# Patient Record
Sex: Male | Born: 1990
Health system: Southern US, Community
[De-identification: ages and names within clinical notes are randomized; demographics above are authoritative.]

## PROBLEM LIST (undated history)

## (undated) DIAGNOSIS — S069X9A Unspecified intracranial injury with loss of consciousness of unspecified duration, initial encounter: Secondary | ICD-10-CM

## (undated) DIAGNOSIS — S82899A Other fracture of unspecified lower leg, initial encounter for closed fracture: Secondary | ICD-10-CM

## (undated) DIAGNOSIS — S069XAA Unspecified intracranial injury with loss of consciousness status unknown, initial encounter: Secondary | ICD-10-CM

## (undated) HISTORY — PX: WISDOM TOOTH EXTRACTION: SHX21

## (undated) HISTORY — PX: TONSILLECTOMY: SUR1361

## (undated) HISTORY — PX: CRANIOTOMY: SHX93

## (undated) HISTORY — PX: ORIF FEMUR FRACTURE: SHX2119

---
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
## 2013-03-30 ENCOUNTER — Encounter: Payer: Self-pay | Admitting: *Deleted

## 2013-04-01 ENCOUNTER — Ambulatory Visit (INDEPENDENT_AMBULATORY_CARE_PROVIDER_SITE_OTHER): Payer: 59 | Admitting: Nurse Practitioner

## 2013-04-01 ENCOUNTER — Encounter: Payer: Self-pay | Admitting: Nurse Practitioner

## 2013-04-01 VITALS — BP 126/78 | Temp 98.2°F | Wt 231.0 lb

## 2013-04-01 DIAGNOSIS — J309 Allergic rhinitis, unspecified: Secondary | ICD-10-CM

## 2013-04-01 DIAGNOSIS — L049 Acute lymphadenitis, unspecified: Secondary | ICD-10-CM

## 2013-04-01 DIAGNOSIS — H6690 Otitis media, unspecified, unspecified ear: Secondary | ICD-10-CM

## 2013-04-01 DIAGNOSIS — H6692 Otitis media, unspecified, left ear: Secondary | ICD-10-CM

## 2013-04-01 MED ORDER — AZITHROMYCIN 250 MG PO TABS: ORAL_TABLET | ORAL | Status: DC

## 2013-04-01 NOTE — Patient Instructions (Addendum)
Nasacort AQ as directed. Continue Allegra or Zyrtec Zaditor eye drops

## 2013-04-02 ENCOUNTER — Encounter: Payer: Self-pay | Admitting: Nurse Practitioner

## 2013-04-02 NOTE — Progress Notes (Signed)
Subjective:  Presents for complaints of a knot behind his right ear for the past week. Has gone down in size. Not as tender. No fever. Also frequent head congestion sneezing itchy watery eyes for the past few weeks. Does a lot of work outdoors. No ear pain sore throat headache. No history of scalp rash or tick bite. No wheezing. No cough. Frequent clearing of his throat. No acid reflux or abdominal pain.  Objective:   BP 126/78  Temp(Src) 98.2 F (36.8 C) (Oral)  Wt 231 lb (104.781 kg) NAD. Alert, oriented. Right TM significant clear effusion, no erythema. Left TM dull with moderate erythema. Pharynx mildly erythematous with cloudy PND noted. Conjunctiva mildly injected. Neck supple with mild anterior nontender adenopathy. 2 small fluctuant lymph nodes noted behind the right ear. Nontender to palpation. No scalp lesions or rash noted. No occipital lymph nodes noted. Lungs clear. Heart regular rate rhythm.  Assessment:Otitis media, left  Allergic rhinitis  Acute lymphadenitis  Plan: Meds ordered this encounter  Medications  . azithromycin (ZITHROMAX Z-PAK) 250 MG tablet    Sig: Take 2 tablets (500 mg) on  Day 1,  followed by 1 tablet (250 mg) once daily on Days 2 through 5.    Dispense:  6 each    Refill:  0    Order Specific Question:  Supervising Provider    Answer:  Merlyn Albert [[id number]]   Nasacort AQ as directed. Continue Allegra or Zyrtec Zaditor eye drops  Expect gradual resolution of lymphadenopathy. Call back in 2 weeks if persists, sooner if worse.

## 2013-07-18 ENCOUNTER — Emergency Department (HOSPITAL_COMMUNITY)
Admission: EM | Admit: 2013-07-18 | Discharge: 2013-07-18 | Disposition: A | Discharge status: Home / Self Care | Payer: PRIVATE HEALTH INSURANCE | Attending: Emergency Medicine | Admitting: Emergency Medicine

## 2013-07-18 ENCOUNTER — Emergency Department (HOSPITAL_COMMUNITY): Payer: PRIVATE HEALTH INSURANCE

## 2013-07-18 ENCOUNTER — Encounter (HOSPITAL_COMMUNITY): Payer: Self-pay | Admitting: *Deleted

## 2013-07-18 DIAGNOSIS — S82841A Displaced bimalleolar fracture of right lower leg, initial encounter for closed fracture: Secondary | ICD-10-CM

## 2013-07-18 DIAGNOSIS — S82843A Displaced bimalleolar fracture of unspecified lower leg, initial encounter for closed fracture: Secondary | ICD-10-CM | POA: Insufficient documentation

## 2013-07-18 DIAGNOSIS — Y9389 Activity, other specified: Secondary | ICD-10-CM | POA: Insufficient documentation

## 2013-07-18 DIAGNOSIS — Y9355 Activity, bike riding: Secondary | ICD-10-CM | POA: Insufficient documentation

## 2013-07-18 DIAGNOSIS — Z87891 Personal history of nicotine dependence: Secondary | ICD-10-CM | POA: Insufficient documentation

## 2013-07-18 MED ORDER — OXYCODONE-ACETAMINOPHEN 5-325 MG PO TABS
2.0000 | ORAL_TABLET | Freq: Once | ORAL | Status: AC
  Administered 2013-07-18: 2 via ORAL
  Filled 2013-07-18: qty 2

## 2013-07-18 MED ORDER — OXYCODONE-ACETAMINOPHEN 5-325 MG PO TABS: 1.0000 | ORAL_TABLET | Freq: Four times a day (QID) | ORAL | Status: DC | PRN

## 2013-07-18 MED ORDER — IBUPROFEN 800 MG PO TABS
800.0000 mg | ORAL_TABLET | Freq: Once | ORAL | Status: AC
  Administered 2013-07-18: 800 mg via ORAL
  Filled 2013-07-18: qty 1

## 2013-07-18 MED ORDER — DICLOFENAC SODIUM 75 MG PO TBEC: 75.0000 mg | DELAYED_RELEASE_TABLET | Freq: Two times a day (BID) | ORAL | Status: DC

## 2013-07-18 MED ORDER — ONDANSETRON HCL 4 MG PO TABS
4.0000 mg | ORAL_TABLET | Freq: Once | ORAL | Status: AC
  Administered 2013-07-18: 4 mg via ORAL
  Filled 2013-07-18: qty 1
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
## 2013-07-19 NOTE — ED Provider Notes (Signed)
Medical screening examination/treatment/procedure(s) were performed by non-physician practitioner and as supervising physician I was immediately available for consultation/collaboration.  Flint Melter, MD 07/19/13 [phone]

## 2013-07-21 ENCOUNTER — Ambulatory Visit (INDEPENDENT_AMBULATORY_CARE_PROVIDER_SITE_OTHER): Payer: PRIVATE HEALTH INSURANCE | Admitting: Orthopedic Surgery

## 2013-07-21 ENCOUNTER — Encounter: Payer: Self-pay | Admitting: Orthopedic Surgery

## 2013-07-21 VITALS — BP 128/79 | Ht 69.0 in | Wt 213.0 lb

## 2013-07-21 DIAGNOSIS — S8263XA Displaced fracture of lateral malleolus of unspecified fibula, initial encounter for closed fracture: Secondary | ICD-10-CM | POA: Insufficient documentation

## 2013-07-21 DIAGNOSIS — S8261XA Displaced fracture of lateral malleolus of right fibula, initial encounter for closed fracture: Secondary | ICD-10-CM

## 2013-07-21 NOTE — Patient Instructions (Signed)
RTW Monday  
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
## 2013-09-07 ENCOUNTER — Ambulatory Visit (INDEPENDENT_AMBULATORY_CARE_PROVIDER_SITE_OTHER): Payer: PRIVATE HEALTH INSURANCE

## 2013-09-07 ENCOUNTER — Ambulatory Visit (INDEPENDENT_AMBULATORY_CARE_PROVIDER_SITE_OTHER): Payer: Self-pay | Admitting: Orthopedic Surgery

## 2013-09-07 ENCOUNTER — Encounter: Payer: Self-pay | Admitting: Orthopedic Surgery

## 2013-09-07 VITALS — BP 122/87 | Ht 69.0 in | Wt 213.0 lb

## 2013-09-07 DIAGNOSIS — S82891D Other fracture of right lower leg, subsequent encounter for closed fracture with routine healing: Secondary | ICD-10-CM

## 2013-09-07 DIAGNOSIS — IMO0001 Reserved for inherently not codable concepts without codable children: Secondary | ICD-10-CM
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
## 2013-09-09 ENCOUNTER — Other Ambulatory Visit: Payer: Self-pay

## 2015-07-19 ENCOUNTER — Emergency Department (HOSPITAL_COMMUNITY)
Admission: EM | Admit: 2015-07-19 | Discharge: 2015-07-19 | Disposition: A | Discharge status: Home / Self Care | Payer: PRIVATE HEALTH INSURANCE | Attending: Emergency Medicine | Admitting: Emergency Medicine

## 2015-07-19 ENCOUNTER — Encounter (HOSPITAL_COMMUNITY): Payer: Self-pay | Admitting: *Deleted

## 2015-07-19 ENCOUNTER — Emergency Department (HOSPITAL_COMMUNITY): Payer: PRIVATE HEALTH INSURANCE

## 2015-07-19 DIAGNOSIS — Z87891 Personal history of nicotine dependence: Secondary | ICD-10-CM | POA: Insufficient documentation

## 2015-07-19 DIAGNOSIS — Y9289 Other specified places as the place of occurrence of the external cause: Secondary | ICD-10-CM | POA: Insufficient documentation

## 2015-07-19 DIAGNOSIS — S8262XA Displaced fracture of lateral malleolus of left fibula, initial encounter for closed fracture: Secondary | ICD-10-CM | POA: Diagnosis not present

## 2015-07-19 DIAGNOSIS — Y998 Other external cause status: Secondary | ICD-10-CM | POA: Insufficient documentation

## 2015-07-19 DIAGNOSIS — S82892A Other fracture of left lower leg, initial encounter for closed fracture: Secondary | ICD-10-CM

## 2015-07-19 DIAGNOSIS — Y9389 Activity, other specified: Secondary | ICD-10-CM | POA: Insufficient documentation

## 2015-07-19 DIAGNOSIS — W1843XA Slipping, tripping and stumbling without falling due to stepping from one level to another, initial encounter: Secondary | ICD-10-CM | POA: Insufficient documentation

## 2015-07-19 DIAGNOSIS — S99912A Unspecified injury of left ankle, initial encounter: Secondary | ICD-10-CM | POA: Diagnosis present

## 2015-07-19 HISTORY — DX: Other fracture of unspecified lower leg, initial encounter for closed fracture: S82.899A
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
## 2015-07-19 NOTE — ED Notes (Signed)
Pt left ED, on crutches with no signs of distress. Pt verbalized discharge instructions.

## 2015-07-19 NOTE — ED Provider Notes (Signed)
CSN: [id number]     Arrival date & time 07/19/15  2023 History   First MD Initiated Contact with Patient 07/19/15 2041     Chief Complaint  Patient presents with  . Ankle Pain     (Consider location/radiation/quality/duration/timing/severity/associated sxs/prior Treatment) HPI   Luis Jarvis is a 24 y.o. male who presents to the Emergency Department complaining of left ankle pain after an inversion injury earlier today.  He describes a sharp , throbbing pain and immediate swelling of the lateral ankle.  Pain is worse weight bearing.  He reports a fx to the same ankle 1-2 years ago.  He is using crutches for weight bearing.  He denies numbness, open wounds, foot pain, or pain/swelling proximal to the ankle.     Past Medical History  Diagnosis Date  . Fx ankle    Past Surgical History  Procedure Laterality Date  . Tonsillectomy    . Wisdom tooth extraction     History reviewed. No pertinent family history. Social History  Substance Use Topics  . Smoking status: Former Smoker    Types: Cigarettes  . Smokeless tobacco: None  . Alcohol Use: No    Review of Systems  Constitutional: Negative for fever and chills.  Genitourinary: Negative for dysuria and difficulty urinating.  Musculoskeletal: Positive for joint swelling and arthralgias.  Skin: Negative for color change and wound.  All other systems reviewed and are negative.     Allergies  Review of patient's allergies indicates no known allergies.  Home Medications   Prior to Admission medications   Medication Sig Start Date End Date Taking? Authorizing Provider  diclofenac (VOLTAREN) 75 MG EC tablet Take 1 tablet (75 mg total) by mouth 2 (two) times daily. 07/18/13   Ivery Quale, PA-C  oxyCODONE-acetaminophen (PERCOCET/ROXICET) 5-325 MG per tablet Take 1 tablet by mouth every 6 (six) hours as needed for pain. 07/18/13   Ivery Quale, PA-C   BP 133/76 mmHg  Pulse 90  Temp(Src) 98.5 F (36.9 C) (Oral)  Resp 18   Ht 6' (1.829 m)  Wt 213 lb (96.616 kg)  BMI 28.88 kg/m2  SpO2 100% Physical Exam  Constitutional: He is oriented to person, place, and time. He appears well-developed and well-nourished. No distress.  HENT:  Head: Normocephalic and atraumatic.  Cardiovascular: Normal rate, regular rhythm, normal heart sounds and intact distal pulses.   Pulmonary/Chest: Effort normal and breath sounds normal.  Musculoskeletal: He exhibits edema and tenderness.  ttp of the lateral left ankle with moderate edema present.    DP pulse is brisk,distal sensation intact.  No erythema, abrasion, bruising or bony deformity.  No proximal tenderness.  Achilles tendon NT.  Compartments soft  Neurological: He is alert and oriented to person, place, and time. He exhibits normal muscle tone. Coordination normal.  Skin: Skin is warm and dry.  Nursing note and vitals reviewed.   ED Course  Procedures (including critical care time) Labs Review Labs Reviewed - No data to display  Imaging Review Dg Ankle Complete Left  07/19/2015   CLINICAL DATA:  Rolled ankle stepping off tractor. Ankle injury and pain. Initial encounter.  EXAM: LEFT ANKLE COMPLETE - 3+ VIEW  COMPARISON:  None.  FINDINGS: Lateral and anterior soft tissue swelling noted. A tiny ossific density is seen along the inferior aspect of the lateral malleolus, consistent with a tiny avulsion fracture fragment. No other fractures are identified. No evidence of dislocation.  IMPRESSION: Tiny avulsion fracture fragment along the inferior aspect of the  lateral malleolus, with associated soft tissue swelling.   Electronically Signed   By: Myles Rosenthal M.D.   On: 07/19/2015 21:14   I have personally reviewed and evaluated these images and lab results as part of my medical decision-making.   EKG Interpretation None      MDM   Final diagnoses:  None    On chart review, previous ankle injury was on the right.    ASO applied, very minimal avulsion fx on XR,  remains NV intact.  Pt has own crutches.  He agrees to RICE therapy and close orthopedic f/u.  Prefers to take tylenol or ibuprofen if needed for pain.  Vitals stable, pt stable for d/c    Pauline Aus, PA-C 07/19/15 2352  Eber Hong, MD 07/20/15 2201

## 2015-07-19 NOTE — Discharge Instructions (Signed)
Ankle Fracture °A fracture is a break in a bone. A cast or splint may be used to protect the ankle and heal the break. Sometimes, surgery is needed. °HOME CARE °· Use crutches as told by your doctor. It is very important that you use your crutches correctly. °· Do not put weight or pressure on the injured ankle until told by your doctor. °· Keep your ankle raised (elevated) when sitting or lying down. °· Apply ice to the ankle: °¨ Put ice in a plastic bag. °¨ Place a towel between your cast and the bag. °¨ Leave the ice on for 20 minutes, 2-3 times a day. °· If you have a plaster or fiberglass cast: °¨ Do not try to scratch under the cast with any objects. °¨ Check the skin around the cast every day. You may put lotion on red or sore areas. °¨ Keep your cast dry and clean. °· If you have a plaster splint: °¨ Wear the splint as told by your doctor. °¨ You can loosen the elastic around the splint if your toes get numb, tingle, or turn cold or blue. °· Do not put pressure on any part of your cast or splint. It may break. Rest your plaster splint or cast only on a pillow the first 24 hours until it is fully hardened. °· Cover your cast or splint with a plastic bag during showers. °· Do not lower your cast or splint into water. °· Take medicine as told by your doctor. °· Do not drive until your doctor says it is safe. °· Follow-up with your doctor as told. It is very important that you go to your follow-up visits. °GET HELP IF: °The swelling and discomfort gets worse.  °GET HELP RIGHT AWAY IF:  °· Your splint or cast breaks. °· You continue to have very bad pain. °· You have new pain or swelling after your splint or cast was put on. °· Your skin or toes below the injured ankle: °¨ Turn blue or gray. °¨ Feel cold, numb, or you cannot feel them. °· There is a bad smell or yellowish white fluid (pus) coming from under the splint or cast. °MAKE SURE YOU:  °· Understand these instructions. °· Will watch your  condition. °· Will get help right away if you are not doing well or get worse. °Document Released: 08/18/2009 Document Revised: 08/11/2013 Document Reviewed: 05/20/2013 °Exit[employer]® Patient Information ©2015 [employer]. This information is not intended to replace advice given to you by your health care provider. Make sure you discuss any questions you have with your health care provider. ° °

## 2015-12-01 ENCOUNTER — Ambulatory Visit (INDEPENDENT_AMBULATORY_CARE_PROVIDER_SITE_OTHER): Payer: PRIVATE HEALTH INSURANCE | Admitting: Family Medicine

## 2015-12-01 VITALS — Wt 237.0 lb

## 2015-12-01 DIAGNOSIS — R5383 Other fatigue: Secondary | ICD-10-CM | POA: Diagnosis not present

## 2015-12-01 DIAGNOSIS — A630 Anogenital (venereal) warts: Secondary | ICD-10-CM | POA: Diagnosis not present

## 2015-12-01 NOTE — Progress Notes (Signed)
   Subjective:    Patient ID: Luis Jarvis, male    DOB: [birth year], 25 y.o.   MRN: [id number]  HPI Patient arrives with c/o wart like bumps on bottom-painful.  he does state that this area is been present for several weeks it just bothers him is difficult to clean himself when he goes to the bathroom he denies having this problem before   in addition this a lot of fatigue tiredness feeling rundown during the day with sleepiness as well he isn't quite sure why he is having this is been going on for months he does snore at nighttime denies fever chills change and appetite.  Review of Systems  see above negative for cough chest pain abdominal pain vomiting diarrhea fever chills    Objective:   Physical Exam  lungs clear heart regular abdomen soft HEENT benign has thick neck   mild obesity.     Assessment & Plan:   significant fatigue tiredness feeling rundown related to probable sleep apnea will check lab work all lab work negative proceed forward with sleep study in addition to this patient is doing a sleep survey will turn it back and  Rectal warts referral to dermatology

## 2015-12-02 ENCOUNTER — Encounter: Payer: Self-pay | Admitting: Family Medicine

## 2015-12-02 LAB — BASIC METABOLIC PANEL
BUN/Creatinine Ratio: 13 (ref 8–19)
BUN: 12 mg/dL (ref 6–20)
CALCIUM: 9.9 mg/dL (ref 8.7–10.2)
CHLORIDE: 102 mmol/L (ref 96–106)
CO2: 22 mmol/L (ref 18–29)
Creatinine, Ser: 0.9 mg/dL (ref 0.76–1.27)
GFR calc non Af Amer: 121 mL/min/{1.73_m2} (ref >59)
GFR, EST AFRICAN AMERICAN: 140 mL/min/{1.73_m2} (ref >59)
Glucose: 95 mg/dL (ref 65–99)
POTASSIUM: 4 mmol/L (ref 3.5–5.2)
SODIUM: 144 mmol/L (ref 134–144)

## 2015-12-02 LAB — CBC WITH DIFFERENTIAL/PLATELET
Basophils Absolute: 0 10*3/uL (ref 0.0–0.2)
Basos: 0 %
EOS (ABSOLUTE): 0.2 10*3/uL (ref 0.0–0.4)
Eos: 2 %
Hematocrit: 47.5 % (ref 37.5–51.0)
Hemoglobin: 16.5 g/dL (ref 12.6–17.7)
IMMATURE GRANULOCYTES: 1 %
Immature Grans (Abs): 0.1 10*3/uL (ref 0.0–0.1)
Lymphocytes Absolute: 2 10*3/uL (ref 0.7–3.1)
Lymphs: 19 %
MCH: 30.6 pg (ref 26.6–33.0)
MCHC: 34.7 g/dL (ref 31.5–35.7)
MCV: 88 fL (ref 79–97)
MONOS ABS: 0.5 10*3/uL (ref 0.1–0.9)
Monocytes: 5 %
NEUTROS PCT: 73 %
Neutrophils Absolute: 7.5 10*3/uL — ABNORMAL HIGH (ref 1.4–7.0)
PLATELETS: 234 10*3/uL (ref 150–379)
RBC: 5.4 x10E6/uL (ref 4.14–5.80)
RDW: 12.3 % (ref 12.3–15.4)
WBC: 10.3 10*3/uL (ref 3.4–10.8)

## 2015-12-02 LAB — TSH: TSH: 1.11 u[IU]/mL (ref 0.450–4.500)

## 2015-12-05 ENCOUNTER — Encounter: Payer: Self-pay | Admitting: Family Medicine

## 2015-12-14 DIAGNOSIS — A630 Anogenital (venereal) warts: Secondary | ICD-10-CM | POA: Diagnosis not present

## 2016-01-04 DIAGNOSIS — A630 Anogenital (venereal) warts: Secondary | ICD-10-CM | POA: Diagnosis not present

## 2016-01-25 DIAGNOSIS — A630 Anogenital (venereal) warts: Secondary | ICD-10-CM | POA: Diagnosis not present

## 2016-02-15 DIAGNOSIS — A630 Anogenital (venereal) warts: Secondary | ICD-10-CM | POA: Diagnosis not present

## 2016-02-22 ENCOUNTER — Encounter: Payer: Self-pay | Admitting: Family Medicine

## 2016-02-22 ENCOUNTER — Ambulatory Visit (INDEPENDENT_AMBULATORY_CARE_PROVIDER_SITE_OTHER): Payer: PRIVATE HEALTH INSURANCE | Admitting: Family Medicine

## 2016-02-22 VITALS — BP 138/86 | Temp 100.4°F | Ht 71.5 in | Wt 228.0 lb

## 2016-02-22 DIAGNOSIS — N419 Inflammatory disease of prostate, unspecified: Secondary | ICD-10-CM | POA: Diagnosis not present

## 2016-02-22 DIAGNOSIS — M545 Low back pain: Secondary | ICD-10-CM | POA: Diagnosis not present

## 2016-02-22 LAB — POCT URINALYSIS DIPSTICK
Leukocytes, UA: NEGATIVE
RBC UA: NEGATIVE
Spec Grav, UA: 1.015
pH, UA: 5

## 2016-02-22 MED ORDER — CIPROFLOXACIN HCL 500 MG PO TABS
500.0000 mg | ORAL_TABLET | Freq: Two times a day (BID) | ORAL | Status: AC
Start: 2016-02-22 — End: 2016-03-03

## 2016-02-22 NOTE — Progress Notes (Signed)
   Subjective:    Patient ID: Luis Jarvis, male    DOB: 03/15/1991, 25 y.o.   MRN: [id number]  Fever  This is a new problem. Episode onset: 2 days ago. Associated symptoms comments: Body aches . He has tried acetaminophen for the symptoms. The treatment provided mild relief.    Results for orders placed or performed in visit on 02/22/16  POCT urinalysis dipstick  Result Value Ref Range   Color, UA Dark Yellow    Clarity, UA Clear    Glucose, UA     Bilirubin, UA     Ketones, UA     Spec Grav, UA 1.015    Blood, UA Negative    pH, UA 5.0    Protein, UA     Urobilinogen, UA     Nitrite, UA     Leukocytes, UA Negative Negative   Pos dysuria  Low bk pain aching in joints diminished energy flulike symptoms. Increase frequency positive dysuria and diffuse low back no nausea or vomiting  Review of Systems  Constitutional: Positive for fever.   see above     Objective:   Physical Exam  Alert vital stable lungs clear heart rare rhythm no CVA tenderness abdomen benign prostate boggy and tender  Urinalysis 4 white blood cells hypertrophy      Assessment & Plan:  Impression acute prostatitis plan antibiotics prescribed. Symptom care warning signs discussed WSL

## 2016-02-29 DIAGNOSIS — A630 Anogenital (venereal) warts: Secondary | ICD-10-CM | POA: Diagnosis not present

## 2016-05-02 ENCOUNTER — Encounter (HOSPITAL_COMMUNITY): Payer: Self-pay | Admitting: Emergency Medicine

## 2016-05-02 ENCOUNTER — Emergency Department (HOSPITAL_COMMUNITY)
Admission: EM | Admit: 2016-05-02 | Discharge: 2016-05-02 | Disposition: A | Discharge status: Home / Self Care | Payer: PRIVATE HEALTH INSURANCE | Attending: Dermatology | Admitting: Dermatology

## 2016-05-02 DIAGNOSIS — Z5321 Procedure and treatment not carried out due to patient leaving prior to being seen by health care provider: Secondary | ICD-10-CM | POA: Insufficient documentation

## 2016-05-02 DIAGNOSIS — H5713 Ocular pain, bilateral: Secondary | ICD-10-CM | POA: Insufficient documentation

## 2016-05-02 DIAGNOSIS — F17210 Nicotine dependence, cigarettes, uncomplicated: Secondary | ICD-10-CM | POA: Insufficient documentation

## 2016-05-02 HISTORY — DX: Unspecified intracranial injury with loss of consciousness status unknown, initial encounter: S06.9XAA

## 2016-05-02 HISTORY — DX: Unspecified intracranial injury with loss of consciousness of unspecified duration, initial encounter: S06.9X9A

## 2016-05-02 NOTE — ED Notes (Signed)
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

## 2016-09-23 ENCOUNTER — Telehealth: Payer: Self-pay | Admitting: Family Medicine

## 2016-09-23 DIAGNOSIS — Z0000 Encounter for general adult medical examination without abnormal findings: Secondary | ICD-10-CM

## 2016-09-23 NOTE — Telephone Encounter (Signed)
Tried to call no answer. Orders ready

## 2016-09-23 NOTE — Telephone Encounter (Signed)
Lipid, liver, metabolic 7, CBC 

## 2016-09-23 NOTE — Telephone Encounter (Signed)
Pt is needing lab orders sent over for an upcoming physical. Last labs per epic were: tsh,bmp,and cbc on 12/01/15.

## 2016-09-30 NOTE — Telephone Encounter (Signed)
Pt.notified

## 2016-10-02 LAB — BASIC METABOLIC PANEL
BUN/Creatinine Ratio: 15 (ref 9–20)
BUN: 14 mg/dL (ref 6–20)
CALCIUM: 9.4 mg/dL (ref 8.7–10.2)
CO2: 26 mmol/L (ref 18–29)
Chloride: 101 mmol/L (ref 96–106)
Creatinine, Ser: 0.96 mg/dL (ref 0.76–1.27)
GFR, EST AFRICAN AMERICAN: 128 mL/min/{1.73_m2} (ref >59)
GFR, EST NON AFRICAN AMERICAN: 111 mL/min/{1.73_m2} (ref >59)
Glucose: 96 mg/dL (ref 65–99)
Potassium: 4.6 mmol/L (ref 3.5–5.2)
Sodium: 141 mmol/L (ref 134–144)

## 2016-10-02 LAB — CBC WITH DIFFERENTIAL/PLATELET
BASOS ABS: 0 10*3/uL (ref 0.0–0.2)
Basos: 0 %
EOS (ABSOLUTE): 0.2 10*3/uL (ref 0.0–0.4)
EOS: 3 %
HEMATOCRIT: 46.9 % (ref 37.5–51.0)
Hemoglobin: 16.4 g/dL (ref 12.6–17.7)
IMMATURE GRANULOCYTES: 0 %
Immature Grans (Abs): 0 10*3/uL (ref 0.0–0.1)
LYMPHS ABS: 1.7 10*3/uL (ref 0.7–3.1)
Lymphs: 29 %
MCH: 30.7 pg (ref 26.6–33.0)
MCHC: 35 g/dL (ref 31.5–35.7)
MCV: 88 fL (ref 79–97)
MONOS ABS: 0.5 10*3/uL (ref 0.1–0.9)
Monocytes: 8 %
NEUTROS PCT: 60 %
Neutrophils Absolute: 3.5 10*3/uL (ref 1.4–7.0)
PLATELETS: 207 10*3/uL (ref 150–379)
RBC: 5.35 x10E6/uL (ref 4.14–5.80)
RDW: 12.9 % (ref 12.3–15.4)
WBC: 5.8 10*3/uL (ref 3.4–10.8)

## 2016-10-02 LAB — HEPATIC FUNCTION PANEL
ALBUMIN: 4.9 g/dL (ref 3.5–5.5)
ALT: 29 IU/L (ref 0–44)
AST: 14 IU/L (ref 0–40)
Alkaline Phosphatase: 66 IU/L (ref 39–117)
BILIRUBIN TOTAL: 0.8 mg/dL (ref 0.0–1.2)
Bilirubin, Direct: 0.18 mg/dL (ref 0.00–0.40)
Total Protein: 6.9 g/dL (ref 6.0–8.5)

## 2016-10-02 LAB — LIPID PANEL
CHOL/HDL RATIO: 3.6 ratio (ref 0.0–5.0)
CHOLESTEROL TOTAL: 175 mg/dL (ref 100–199)
HDL: 49 mg/dL (ref >39)
LDL CALC: 97 mg/dL (ref 0–99)
Triglycerides: 144 mg/dL (ref 0–149)
VLDL Cholesterol Cal: 29 mg/dL (ref 5–40)

## 2016-10-08 ENCOUNTER — Encounter: Payer: Self-pay | Admitting: Family Medicine

## 2016-10-08 ENCOUNTER — Ambulatory Visit (INDEPENDENT_AMBULATORY_CARE_PROVIDER_SITE_OTHER): Payer: PRIVATE HEALTH INSURANCE | Admitting: Family Medicine

## 2016-10-08 VITALS — BP 124/74 | Ht 70.5 in | Wt 235.0 lb

## 2016-10-08 DIAGNOSIS — Z0000 Encounter for general adult medical examination without abnormal findings: Secondary | ICD-10-CM

## 2016-10-08 NOTE — Patient Instructions (Signed)
Steps to Quit Smoking Smoking tobacco can be harmful to your health and can affect almost every organ in your body. Smoking puts you, and those around you, at risk for developing many serious chronic diseases. Quitting smoking is difficult, but it is one of the best things that you can do for your health. It is never too late to quit. What are the benefits of quitting smoking? When you quit smoking, you lower your risk of developing serious diseases and conditions, such as:  Lung cancer or lung disease, such as COPD.  Heart disease.  Stroke.  Heart attack.  Infertility.  Osteoporosis and bone fractures.  Additionally, symptoms such as coughing, wheezing, and shortness of breath may get better when you quit. You may also find that you get sick less often because your body is stronger at fighting off colds and infections. If you are pregnant, quitting smoking can help to reduce your chances of having a baby of low birth weight. How do I get ready to quit? When you decide to quit smoking, create a plan to make sure that you are successful. Before you quit:  Pick a date to quit. Set a date within the next two weeks to give you time to prepare.  Write down the reasons why you are quitting. Keep this list in places where you will see it often, such as on your bathroom mirror or in your car or wallet.  Identify the people, places, things, and activities that make you want to smoke (triggers) and avoid them. Make sure to take these actions: ? Throw away all cigarettes at home, at work, and in your car. ? Throw away smoking accessories, such as ashtrays and lighters. ? Clean your car and make sure to empty the ashtray. ? Clean your home, including curtains and carpets.  Tell your family, friends, and coworkers that you are quitting. Support from your loved ones can make quitting easier.  Talk with your health care provider about your options for quitting smoking.  Find out what treatment  options are covered by your health insurance.  What strategies can I use to quit smoking? Talk with your healthcare provider about different strategies to quit smoking. Some strategies include:  Quitting smoking altogether instead of gradually lessening how much you smoke over a period of time. Research shows that quitting "cold turkey" is more successful than gradually quitting.  Attending in-person counseling to help you build problem-solving skills. You are more likely to have success in quitting if you attend several counseling sessions. Even short sessions of 10 minutes can be effective.  Finding resources and support systems that can help you to quit smoking and remain smoke-free after you quit. These resources are most helpful when you use them often. They can include: ? Online chats with a counselor. ? Telephone quitlines. ? Printed self-help materials. ? Support groups or group counseling. ? Text messaging programs. ? Mobile phone applications.  Taking medicines to help you quit smoking. (If you are pregnant or breastfeeding, talk with your health care provider first.) Some medicines contain nicotine and some do not. Both types of medicines help with cravings, but the medicines that include nicotine help to relieve withdrawal symptoms. Your health care provider may recommend: ? Nicotine patches, gum, or lozenges. ? Nicotine inhalers or sprays. ? Non-nicotine medicine that is taken by mouth.  Talk with your health care provider about combining strategies, such as taking medicines while you are also receiving in-person counseling. Using these two strategies together   makes you more likely to succeed in quitting than if you used either strategy on its own. If you are pregnant or breastfeeding, talk with your health care provider about finding counseling or other support strategies to quit smoking. Do not take medicine to help you quit smoking unless told to do so by your health care  provider. What things can I do to make it easier to quit? Quitting smoking might feel overwhelming at first, but there is a lot that you can do to make it easier. Take these important actions:  Reach out to your family and friends and ask that they support and encourage you during this time. Call telephone quitlines, reach out to support groups, or work with a counselor for support.  Ask people who smoke to avoid smoking around you.  Avoid places that trigger you to smoke, such as bars, parties, or smoke-break areas at work.  Spend time around people who do not smoke.  Lessen stress in your life, because stress can be a smoking trigger for some people. To lessen stress, try: ? Exercising regularly. ? Deep-breathing exercises. ? Yoga. ? Meditating. ? Performing a body scan. This involves closing your eyes, scanning your body from head to toe, and noticing which parts of your body are particularly tense. Purposefully relax the muscles in those areas.  Download or purchase mobile phone or tablet apps (applications) that can help you stick to your quit plan by providing reminders, tips, and encouragement. There are many free apps, such as QuitGuide from the CDC (Centers for Disease Control and Prevention). You can find other support for quitting smoking (smoking cessation) through smokefree.gov and other websites.  How will I feel when I quit smoking? Within the first 24 hours of quitting smoking, you may start to feel some withdrawal symptoms. These symptoms are usually most noticeable 2-3 days after quitting, but they usually do not last beyond 2-3 weeks. Changes or symptoms that you might experience include:  Mood swings.  Restlessness, anxiety, or irritation.  Difficulty concentrating.  Dizziness.  Strong cravings for sugary foods in addition to nicotine.  Mild weight gain.  Constipation.  Nausea.  Coughing or a sore throat.  Changes in how your medicines work in your  body.  A depressed mood.  Difficulty sleeping (insomnia).  After the first 2-3 weeks of quitting, you may start to notice more positive results, such as:  Improved sense of smell and taste.  Decreased coughing and sore throat.  Slower heart rate.  Lower blood pressure.  Clearer skin.  The ability to breathe more easily.  Fewer sick days.  Quitting smoking is very challenging for most people. Do not get discouraged if you are not successful the first time. Some people need to make many attempts to quit before they achieve long-term success. Do your best to stick to your quit plan, and talk with your health care provider if you have any questions or concerns. This information is not intended to replace advice given to you by your health care provider. Make sure you discuss any questions you have with your health care provider. Document Released: 10/15/2001 Document Revised: 06/18/2016 Document Reviewed: 03/07/2015 Elsevier Interactive Patient Education  2017 [employer]  

## 2016-10-08 NOTE — Progress Notes (Signed)
   Subjective:    Patient ID: Luis Jarvis, male    DOB: 01/29/1991, 25 y.o.   MRN: [id number]  HPI The patient comes in today for a wellness visit.    A review of their health history was completed.  A review of medications was also completed.  Any needed refills; not taking any meds  Eating habits: health conscious  Falls/  MVA accidents in past few months: none  Regular exercise: none  Specialist pt sees on regular basis: none  Preventative health issues were discussed.   Additional concerns: none  Declines flu vaccine   Review of Systems  Constitutional: Negative for activity change, appetite change and fever.  HENT: Negative for congestion and rhinorrhea.   Eyes: Negative for discharge.  Respiratory: Negative for cough and wheezing.   Cardiovascular: Negative for chest pain.  Gastrointestinal: Negative for abdominal pain, blood in stool and vomiting.  Genitourinary: Negative for difficulty urinating and frequency.  Musculoskeletal: Negative for neck pain.  Skin: Negative for rash.  Allergic/Immunologic: Negative for environmental allergies and food allergies.  Neurological: Negative for weakness and headaches.  Psychiatric/Behavioral: Negative for agitation.       Objective:   Physical Exam  Constitutional: He appears well-developed and well-nourished.  HENT:  Head: Normocephalic and atraumatic.  Right Ear: External ear normal.  Left Ear: External ear normal.  Nose: Nose normal.  Mouth/Throat: Oropharynx is clear and moist.  Eyes: EOM are normal. Pupils are equal, round, and reactive to light.  Neck: Normal range of motion. Neck supple. No thyromegaly present.  Cardiovascular: Normal rate, regular rhythm and normal heart sounds.   No murmur heard. Pulmonary/Chest: Effort normal and breath sounds normal. No respiratory distress. He has no wheezes.  Abdominal: Soft. Bowel sounds are normal. He exhibits no distension and no mass. There is no tenderness.    Genitourinary: Penis normal.  Musculoskeletal: Normal range of motion. He exhibits no edema.  Lymphadenopathy:    He has no cervical adenopathy.  Neurological: He is alert. He exhibits normal muscle tone.  Skin: Skin is warm and dry. No erythema.  Psychiatric: He has a normal mood and affect. His behavior is normal. Judgment normal.          Assessment & Plan:  Adult wellness-complete.wellness physical was conducted today. Importance of diet and exercise were discussed in detail. In addition to this a discussion regarding safety was also covered. We also reviewed over immunizations and gave recommendations regarding current immunization needed for age. In addition to this additional areas were also touched on including: Preventative health exams needed: Colonoscopy Not indicated Patient was encouraged to lose weight current exercise watch diet follow-up for yearly checkup lab work was reviewed with the patient  Patient was advised yearly wellness exam

## 2017-03-03 ENCOUNTER — Ambulatory Visit: Payer: PRIVATE HEALTH INSURANCE | Admitting: Nurse Practitioner

## 2017-03-19 ENCOUNTER — Ambulatory Visit (INDEPENDENT_AMBULATORY_CARE_PROVIDER_SITE_OTHER): Payer: PRIVATE HEALTH INSURANCE | Admitting: Family Medicine

## 2017-03-19 ENCOUNTER — Encounter: Payer: Self-pay | Admitting: Family Medicine

## 2017-03-19 VITALS — BP 118/82 | Temp 97.8°F | Ht 70.5 in | Wt 245.4 lb

## 2017-03-19 DIAGNOSIS — L03112 Cellulitis of left axilla: Secondary | ICD-10-CM | POA: Diagnosis not present

## 2017-03-19 MED ORDER — CEPHALEXIN 500 MG PO CAPS: 500.0000 mg | ORAL_CAPSULE | Freq: Four times a day (QID) | ORAL | 0 refills | Status: DC

## 2017-03-19 NOTE — Progress Notes (Signed)
   Subjective:    Patient ID: Luis Jarvis, male    DOB: 11/20/1990, 26 y.o.   MRN: [id number]  HPI  Patient arrives with a knot in left arm pit- sore to touch for a few weeks. Been sore to the touch over the past couple weeks concerned about the possibility of infection denies fever chills sweats no nausea vomiting diarrhea Review of Systems    see above Objective:   Physical Exam Small amount of cellulitis under the left arm no abscess area is approximately less than a half inch diameter   Patient was warned if it becomes an abscess to follow-up    Assessment & Plan:  Simple cellulitis-Keflex 4 times a day 10 days warm compresses frequently no abscess follow up if  problems

## 2017-07-14 ENCOUNTER — Encounter: Payer: Self-pay | Admitting: Family Medicine

## 2017-07-14 ENCOUNTER — Ambulatory Visit (INDEPENDENT_AMBULATORY_CARE_PROVIDER_SITE_OTHER): Payer: PRIVATE HEALTH INSURANCE | Admitting: Family Medicine

## 2017-07-14 VITALS — BP 118/76 | Ht 71.0 in | Wt 238.0 lb

## 2017-07-14 DIAGNOSIS — Z0000 Encounter for general adult medical examination without abnormal findings: Secondary | ICD-10-CM

## 2017-07-14 NOTE — Patient Instructions (Signed)
Steps to Quit Smoking Smoking tobacco can be bad for your health. It can also affect almost every organ in your body. Smoking puts you and people around you at risk for many serious long-lasting (chronic) diseases. Quitting smoking is hard, but it is one of the best things that you can do for your health. It is never too late to quit. What are the benefits of quitting smoking? When you quit smoking, you lower your risk for getting serious diseases and conditions. They can include:  Lung cancer or lung disease.  Heart disease.  Stroke.  Heart attack.  Not being able to have children (infertility).  Weak bones (osteoporosis) and broken bones (fractures).  If you have coughing, wheezing, and shortness of breath, those symptoms may get better when you quit. You may also get sick less often. If you are pregnant, quitting smoking can help to lower your chances of having a baby of low birth weight. What can I do to help me quit smoking? Talk with your doctor about what can help you quit smoking. Some things you can do (strategies) include:  Quitting smoking totally, instead of slowly cutting back how much you smoke over a period of time.  Going to in-person counseling. You are more likely to quit if you go to many counseling sessions.  Using resources and support systems, such as: ? Online chats with a counselor. ? Phone quitlines. ? Printed self-help materials. ? Support groups or group counseling. ? Text messaging programs. ? Mobile phone apps or applications.  Taking medicines. Some of these medicines may have nicotine in them. If you are pregnant or breastfeeding, do not take any medicines to quit smoking unless your doctor says it is okay. Talk with your doctor about counseling or other things that can help you.  Talk with your doctor about using more than one strategy at the same time, such as taking medicines while you are also going to in-person counseling. This can help make  quitting easier. What things can I do to make it easier to quit? Quitting smoking might feel very hard at first, but there is a lot that you can do to make it easier. Take these steps:  Talk to your family and friends. Ask them to support and encourage you.  Call phone quitlines, reach out to support groups, or work with a counselor.  Ask people who smoke to not smoke around you.  Avoid places that make you want (trigger) to smoke, such as: ? Bars. ? Parties. ? Smoke-break areas at work.  Spend time with people who do not smoke.  Lower the stress in your life. Stress can make you want to smoke. Try these things to help your stress: ? Getting regular exercise. ? Deep-breathing exercises. ? Yoga. ? Meditating. ? Doing a body scan. To do this, close your eyes, focus on one area of your body at a time from head to toe, and notice which parts of your body are tense. Try to relax the muscles in those areas.  Download or buy apps on your mobile phone or tablet that can help you stick to your quit plan. There are many free apps, such as QuitGuide from the CDC (Centers for Disease Control and Prevention). You can find more support from smokefree.gov and other websites.  This information is not intended to replace advice given to you by your health care provider. Make sure you discuss any questions you have with your health care provider. Document Released: 08/17/2009 Document   Revised: 06/18/2016 Document Reviewed: 03/07/2015 Elsevier Interactive Patient Education  2018 [employer]  

## 2017-07-14 NOTE — Progress Notes (Signed)
   Subjective:    Patient ID: Luis Jarvis, male    DOB: 11/08/1990, 26 y.o.   MRN: [id number]  HPI The patient comes in today for a wellness visit.  He does smoke 2-3 cigarettes a week he's been counseled to quit smoking He watches how he eats somewhat but does not do a great job He states physically active but does not do any purposeful exercise   A review of their health history was completed.  A review of medications was also completed.  Any needed refills; not taking any meds  Eating habits: health conscious  Falls/  MVA accidents in past few months: none  Regular exercise: none  Specialist pt sees on regular basis: none  Preventative health issues were discussed.   Additional concerns: none    Review of Systems  Constitutional: Negative for activity change, appetite change and fever.  HENT: Negative for congestion and rhinorrhea.   Eyes: Negative for discharge.  Respiratory: Negative for cough and wheezing.   Cardiovascular: Negative for chest pain.  Gastrointestinal: Negative for abdominal pain, blood in stool and vomiting.  Genitourinary: Negative for difficulty urinating and frequency.  Musculoskeletal: Negative for neck pain.  Skin: Negative for rash.  Allergic/Immunologic: Negative for environmental allergies and food allergies.  Neurological: Negative for weakness and headaches.  Psychiatric/Behavioral: Negative for agitation.       Objective:   Physical Exam  Constitutional: He appears well-developed and well-nourished.  HENT:  Head: Normocephalic and atraumatic.  Right Ear: External ear normal.  Left Ear: External ear normal.  Nose: Nose normal.  Mouth/Throat: Oropharynx is clear and moist.  Eyes: Pupils are equal, round, and reactive to light. EOM are normal.  Neck: Normal range of motion. Neck supple. No thyromegaly present.  Cardiovascular: Normal rate, regular rhythm and normal heart sounds.   No murmur heard. Pulmonary/Chest: Effort  normal and breath sounds normal. No respiratory distress. He has no wheezes.  Abdominal: Soft. Bowel sounds are normal. He exhibits no distension and no mass. There is no tenderness.  Genitourinary: Penis normal.  Musculoskeletal: Normal range of motion. He exhibits no edema.  Lymphadenopathy:    He has no cervical adenopathy.  Neurological: He is alert. He exhibits normal muscle tone.  Skin: Skin is warm and dry. No erythema.  Psychiatric: He has a normal mood and affect. His behavior is normal. Judgment normal.    Prostate exam not indicated    Assessment & Plan:  Adult wellness-complete.wellness physical was conducted today. Importance of diet and exercise were discussed in detail. In addition to this a discussion regarding safety was also covered. We also reviewed over immunizations and gave recommendations regarding current immunization needed for age. In addition to this additional areas were also touched on including: Preventative health exams needed: Colonoscopy not indicated  Patient was advised yearly wellness exam Healthy diet recommended Keep weight at 2/20 possible Avoid sugary drinks Patient encouraged quit smoking totally Screening labs ordered

## 2017-07-16 LAB — CBC WITH DIFFERENTIAL/PLATELET
BASOS ABS: 0 10*3/uL (ref 0.0–0.2)
Basos: 1 %
EOS (ABSOLUTE): 0.2 10*3/uL (ref 0.0–0.4)
EOS: 4 %
HEMATOCRIT: 42.8 % (ref 37.5–51.0)
HEMOGLOBIN: 15.2 g/dL (ref 13.0–17.7)
IMMATURE GRANULOCYTES: 1 %
Immature Grans (Abs): 0 10*3/uL (ref 0.0–0.1)
LYMPHS ABS: 1.5 10*3/uL (ref 0.7–3.1)
LYMPHS: 27 %
MCH: 30.5 pg (ref 26.6–33.0)
MCHC: 35.5 g/dL (ref 31.5–35.7)
MCV: 86 fL (ref 79–97)
MONOCYTES: 8 %
Monocytes Absolute: 0.5 10*3/uL (ref 0.1–0.9)
NEUTROS PCT: 59 %
Neutrophils Absolute: 3.4 10*3/uL (ref 1.4–7.0)
Platelets: 191 10*3/uL (ref 150–379)
RBC: 4.99 x10E6/uL (ref 4.14–5.80)
RDW: 12.9 % (ref 12.3–15.4)
WBC: 5.6 10*3/uL (ref 3.4–10.8)

## 2017-07-16 LAB — BASIC METABOLIC PANEL
BUN/Creatinine Ratio: 10 (ref 9–20)
BUN: 10 mg/dL (ref 6–20)
CALCIUM: 9.3 mg/dL (ref 8.7–10.2)
CO2: 23 mmol/L (ref 20–29)
CREATININE: 1.01 mg/dL (ref 0.76–1.27)
Chloride: 102 mmol/L (ref 96–106)
GFR calc Af Amer: 120 mL/min/{1.73_m2} (ref >59)
GFR calc non Af Amer: 104 mL/min/{1.73_m2} (ref >59)
GLUCOSE: 88 mg/dL (ref 65–99)
POTASSIUM: 4.4 mmol/L (ref 3.5–5.2)
SODIUM: 141 mmol/L (ref 134–144)

## 2017-07-16 LAB — HEPATIC FUNCTION PANEL
ALT: 48 IU/L — AB (ref 0–44)
AST: 30 IU/L (ref 0–40)
Albumin: 4.6 g/dL (ref 3.5–5.5)
Alkaline Phosphatase: 67 IU/L (ref 39–117)
Bilirubin Total: 0.8 mg/dL (ref 0.0–1.2)
Bilirubin, Direct: 0.2 mg/dL (ref 0.00–0.40)
TOTAL PROTEIN: 6.5 g/dL (ref 6.0–8.5)

## 2017-07-16 LAB — LIPID PANEL
CHOL/HDL RATIO: 3.6 ratio (ref 0.0–5.0)
Cholesterol, Total: 148 mg/dL (ref 100–199)
HDL: 41 mg/dL (ref >39)
LDL CALC: 80 mg/dL (ref 0–99)
TRIGLYCERIDES: 135 mg/dL (ref 0–149)
VLDL CHOLESTEROL CAL: 27 mg/dL (ref 5–40)

## 2018-08-12 ENCOUNTER — Ambulatory Visit (INDEPENDENT_AMBULATORY_CARE_PROVIDER_SITE_OTHER): Payer: PRIVATE HEALTH INSURANCE | Admitting: Family Medicine

## 2018-08-12 ENCOUNTER — Encounter: Payer: Self-pay | Admitting: Family Medicine

## 2018-08-12 VITALS — BP 140/98 | Temp 98.1°F | Ht 71.0 in | Wt 252.4 lb

## 2018-08-12 DIAGNOSIS — L0501 Pilonidal cyst with abscess: Secondary | ICD-10-CM | POA: Diagnosis not present

## 2018-08-12 MED ORDER — DOXYCYCLINE HYCLATE 100 MG PO TABS: 100.0000 mg | ORAL_TABLET | Freq: Two times a day (BID) | ORAL | 0 refills | Status: DC

## 2018-08-12 NOTE — Progress Notes (Signed)
   Subjective:    Patient ID: Luis Jarvis, male    DOB: [birth year], 27 y.o.   MRN: [id number]  HPI Pt here for cyst on top of buttock area. Pt states it has an odor and has been draining. Pt states it has been there for about a week.  This patient has a area on his upper buttock region next causing significant pain and discomfort patient denies high fever chills he does state it is draining some has never had this before denies high fever chills sweats rectal bleeding denies abdominal pain difficulty breathing  Review of Systems Please see above    Objective:   Physical Exam  Lungs clear respiratory rate normal heart regular no murmurs lower back he has a pilonidal cyst with a small drainage hole      Assessment & Plan:  Pilonidal cyst Regional infection Does not need immediate I&D Antibiotics twice daily with warm compresses Referral to surgeon urgently for evaluation and surgical removal

## 2018-08-13 ENCOUNTER — Other Ambulatory Visit: Payer: Self-pay | Admitting: Family Medicine

## 2018-08-13 DIAGNOSIS — L0501 Pilonidal cyst with abscess: Secondary | ICD-10-CM

## 2018-08-13 NOTE — Progress Notes (Signed)
Urgent referral placed and patient notified.

## 2018-08-18 ENCOUNTER — Ambulatory Visit (INDEPENDENT_AMBULATORY_CARE_PROVIDER_SITE_OTHER): Payer: PRIVATE HEALTH INSURANCE | Admitting: General Surgery

## 2018-08-18 ENCOUNTER — Encounter: Payer: Self-pay | Admitting: General Surgery

## 2018-08-18 VITALS — BP 145/85 | HR 78 | Temp 97.5°F | Resp 18 | Wt 256.8 lb

## 2018-08-18 DIAGNOSIS — L0591 Pilonidal cyst without abscess: Secondary | ICD-10-CM | POA: Diagnosis not present

## 2018-08-18 NOTE — Patient Instructions (Signed)
Pilonidal Cyst A pilonidal cyst is a fluid-filled sac. It forms beneath the skin near your tailbone, at the top of the crease of your buttocks. A pilonidal cyst that is not large or infected may not cause symptoms or problems. If the cyst becomes irritated or infected, it may fill with pus. This causes pain and swelling (pilonidal abscess). An infected cyst may need to be treated with medicine, drained, or removed. What are the causes? The cause of a pilonidal cyst is not known. One cause may be a hair that grows into your skin (ingrown hair). What increases the risk? Pilonidal cysts are more common in boys and men. Risk factors include:  Having lots of hair near the crease of the buttocks.  Being overweight.  Having a pilonidal dimple.  Wearing tight clothing.  Not bathing or showering frequently.  Sitting for long periods of time.  What are the signs or symptoms? Signs and symptoms of a pilonidal cyst may include:  Redness.  Pain and tenderness.  Warmth.  Swelling.  Pus.  Fever.  How is this diagnosed? Your health care provider may diagnose a pilonidal cyst based on your symptoms and a physical exam. The health care provider may do a blood test to check for infection. If your cyst is draining pus, your health care provider may take a sample of the drainage to be tested at a laboratory. How is this treated? Surgery is the usual treatment for an infected pilonidal cyst. You may also have to take medicines before surgery. The type of surgery you have depends on the size and severity of the infected cyst. The different kinds of surgery include:  Incision and drainage. This is a procedure to open and drain the cyst.  Marsupialization. In this procedure, a large cyst or abscess may be opened and kept open by stitching the edges of the skin to the cyst walls.  Cyst removal. This procedure involves opening the skin and removing all or part of the cyst.  Follow these  instructions at home:  Follow all of your surgeon's instructions carefully if you had surgery.  Take medicines only as directed by your health care provider.  If you were prescribed an antibiotic medicine, finish it all even if you start to feel better.  Keep the area around your pilonidal cyst clean and dry.  Clean the area as directed by your health care provider. Pat the area dry with a clean towel. Do not rub it as this may cause bleeding.  Remove hair from the area around the cyst as directed by your health care provider.  Do not wear tight clothing or sit in one place for long periods of time.  There are many different ways to close and cover an incision, including stitches, skin glue, and adhesive strips. Follow your health care provider's instructions on: ? Incision care. ? Bandage (dressing) changes and removal. ? Incision closure removal. Contact a health care provider if:  You have drainage, redness, swelling, or pain at the site of the cyst.  You have a fever. This information is not intended to replace advice given to you by your health care provider. Make sure you discuss any questions you have with your health care provider. Document Released: 10/18/2000 Document Revised: 03/28/2016 Document Reviewed: 03/10/2014 Elsevier Interactive Patient Education  2018 [employer]  

## 2018-08-19 NOTE — Progress Notes (Signed)
Luis Jarvis; [id number]; [birth year]   HPI Patient is a 27 year old white male who was referred to my care by Dr. Lilyan Punt for evaluation and treatment of a pilonidal cyst.  Patient states he has had episodes of drainage from his pilonidal cyst region in the past.  This started approximately 2 weeks ago.  He was placed on antibiotic.  He has had bloody drainage since that time.  It is tender to touch.  He currently has a pain level 5 out of 10.  He is still taking doxycycline for treatment. Past Medical History:  Diagnosis Date  . Fx ankle   . TBI (traumatic brain injury) [facility])     Past Surgical History:  Procedure Laterality Date  . TONSILLECTOMY    . WISDOM TOOTH EXTRACTION      History reviewed. No pertinent family history.  Current Outpatient Medications on File Prior to Visit  Medication Sig Dispense Refill  . doxycycline (VIBRA-TABS) 100 MG tablet Take 1 tablet (100 mg total) by mouth 2 (two) times daily. 20 tablet 0   No current facility-administered medications on file prior to visit.     No Known Allergies  Social History   Substance and Sexual Activity  Alcohol Use Yes    Social History   Tobacco Use  Smoking Status Current Some Day Smoker  . Types: Cigarettes  Smokeless Tobacco Never Used    Review of Systems  Constitutional: Negative.   HENT: Negative.   Eyes: Negative.   Respiratory: Negative.   Cardiovascular: Negative.   Gastrointestinal: Negative.   Genitourinary: Negative.   Musculoskeletal: Negative.   Skin: Negative.   Neurological: Negative.   Endo/Heme/Allergies: Negative.   Psychiatric/Behavioral: Negative.     Objective   Vitals:   08/18/18 1127  BP: (!) 145/85  Pulse: 78  Resp: 18  Temp: (!) 97.5 F (36.4 C)    Physical Exam  Constitutional: He is oriented to person, place, and time. He appears well-developed and well-nourished. No distress.  HENT:  Head: Normocephalic and atraumatic.  Cardiovascular: Normal rate,  regular rhythm and normal heart sounds. Exam reveals no gallop and no friction rub.  No murmur heard. Pulmonary/Chest: Effort normal and breath sounds normal. No stridor. No respiratory distress. He has no wheezes. He has no rales.  Neurological: He is alert and oriented to person, place, and time.  Skin: Skin is warm and dry.  There is a fluctuant draining area just to the left in the pilonidal region.  Some induration and erythema is noted.  No purulent drainage noted.  Is primarily serosanguineous in nature.  This area is very hairy.  Vitals reviewed. Dr. Fletcher Anon notes reviewed  Assessment  Infected pilonidal cyst, resolving Plan   No need for incision and drainage at this time.  Will need probable excision of the pilonidal cyst once this infected episode has passed.  This was explained to the patient.  He will return in 2 weeks for wound assessment.  Finish antibiotic course.

## 2018-08-28 ENCOUNTER — Encounter: Payer: Self-pay | Admitting: Family Medicine

## 2018-08-28 ENCOUNTER — Ambulatory Visit (INDEPENDENT_AMBULATORY_CARE_PROVIDER_SITE_OTHER): Payer: PRIVATE HEALTH INSURANCE | Admitting: Family Medicine

## 2018-08-28 VITALS — BP 128/84 | Ht 71.0 in | Wt 246.0 lb

## 2018-08-28 DIAGNOSIS — Z0000 Encounter for general adult medical examination without abnormal findings: Secondary | ICD-10-CM

## 2018-08-28 DIAGNOSIS — Z13220 Encounter for screening for lipoid disorders: Secondary | ICD-10-CM

## 2018-08-28 DIAGNOSIS — Z131 Encounter for screening for diabetes mellitus: Secondary | ICD-10-CM | POA: Diagnosis not present

## 2018-08-28 NOTE — Progress Notes (Signed)
   Subjective:    Patient ID: Luis Jarvis, male    DOB: 10/31/1991, 27 y.o.   MRN: [id number]  HPI  The patient comes in today for a wellness visit.  Patient smokes he has been encouraged to quit smoking Patient does weigh more than he should He has been counseled to try to lose weight He is also been counseled on safety  A review of their health history was completed.  A review of medications was also completed.  Any needed refills; none  Eating habits: eating healthy  Falls/  MVA accidents in past few months: none  Regular exercise: yes- works Science [employer] pt sees on regular basis: no  Preventative health issues were discussed.   Additional concerns: none   Review of Systems  Constitutional: Negative for activity change, appetite change and fever.  HENT: Negative for congestion and rhinorrhea.   Eyes: Negative for discharge.  Respiratory: Negative for cough and wheezing.   Cardiovascular: Negative for chest pain.  Gastrointestinal: Negative for abdominal pain, blood in stool and vomiting.  Genitourinary: Negative for difficulty urinating and frequency.  Musculoskeletal: Negative for neck pain.  Skin: Negative for rash.  Allergic/Immunologic: Negative for environmental allergies and food allergies.  Neurological: Negative for weakness and headaches.  Psychiatric/Behavioral: Negative for agitation.       Objective:   Physical Exam  Constitutional: He appears well-developed and well-nourished.  HENT:  Head: Normocephalic and atraumatic.  Right Ear: External ear normal.  Left Ear: External ear normal.  Nose: Nose normal.  Mouth/Throat: Oropharynx is clear and moist.  Eyes: Pupils are equal, round, and reactive to light. EOM are normal.  Neck: Normal range of motion. Neck supple. No thyromegaly present.  Cardiovascular: Normal rate, regular rhythm and normal heart sounds.  No murmur heard. Pulmonary/Chest: Effort normal and breath sounds normal. No  respiratory distress. He has no wheezes.  Abdominal: Soft. Bowel sounds are normal. He exhibits no distension and no mass. There is no tenderness.  Genitourinary: Penis normal.  Musculoskeletal: Normal range of motion. He exhibits no edema.  Lymphadenopathy:    He has no cervical adenopathy.  Neurological: He is alert. He exhibits normal muscle tone.  Skin: Skin is warm and dry. No erythema.  Psychiatric: He has a normal mood and affect. His behavior is normal. Judgment normal.    GU normal no hernia no murmurs      Assessment & Plan:  Adult wellness-complete.wellness physical was conducted today. Importance of diet and exercise were discussed in detail.  In addition to this a discussion regarding safety was also covered. We also reviewed over immunizations and gave recommendations regarding current immunization needed for age.  In addition to this additional areas were also touched on including: Preventative health exams needed:  Colonoscopy not indicated  Patient was advised yearly wellness exam

## 2018-08-29 LAB — HEPATIC FUNCTION PANEL
ALBUMIN: 4.8 g/dL (ref 3.5–5.5)
ALK PHOS: 68 IU/L (ref 39–117)
ALT: 38 IU/L (ref 0–44)
AST: 21 IU/L (ref 0–40)
BILIRUBIN TOTAL: 0.6 mg/dL (ref 0.0–1.2)
BILIRUBIN, DIRECT: 0.14 mg/dL (ref 0.00–0.40)
TOTAL PROTEIN: 7 g/dL (ref 6.0–8.5)

## 2018-08-29 LAB — BASIC METABOLIC PANEL
BUN / CREAT RATIO: 13 (ref 9–20)
BUN: 12 mg/dL (ref 6–20)
CO2: 23 mmol/L (ref 20–29)
CREATININE: 0.96 mg/dL (ref 0.76–1.27)
Calcium: 9.9 mg/dL (ref 8.7–10.2)
Chloride: 100 mmol/L (ref 96–106)
GFR, EST AFRICAN AMERICAN: 126 mL/min/{1.73_m2} (ref >59)
GFR, EST NON AFRICAN AMERICAN: 109 mL/min/{1.73_m2} (ref >59)
GLUCOSE: 80 mg/dL (ref 65–99)
Potassium: 4.7 mmol/L (ref 3.5–5.2)
SODIUM: 141 mmol/L (ref 134–144)

## 2018-08-29 LAB — LIPID PANEL
CHOL/HDL RATIO: 3.1 ratio (ref 0.0–5.0)
Cholesterol, Total: 143 mg/dL (ref 100–199)
HDL: 46 mg/dL (ref >39)
LDL Calculated: 63 mg/dL (ref 0–99)
Triglycerides: 170 mg/dL — ABNORMAL HIGH (ref 0–149)
VLDL Cholesterol Cal: 34 mg/dL (ref 5–40)

## 2018-09-01 ENCOUNTER — Encounter: Payer: Self-pay | Admitting: General Surgery

## 2018-09-01 ENCOUNTER — Ambulatory Visit (INDEPENDENT_AMBULATORY_CARE_PROVIDER_SITE_OTHER): Payer: PRIVATE HEALTH INSURANCE | Admitting: General Surgery

## 2018-09-01 VITALS — BP 132/80 | HR 99 | Temp 96.8°F | Resp 18 | Wt 245.0 lb

## 2018-09-01 DIAGNOSIS — L0591 Pilonidal cyst without abscess: Secondary | ICD-10-CM | POA: Diagnosis not present

## 2018-09-01 NOTE — Progress Notes (Signed)
Subjective:     Luis Jarvis  Here for follow-up of pilonidal cyst.  Patient states he has had no further drainage.  No significant pain noted.  It seems to have resolved. Objective:    BP 132/80 (BP Location: Left Arm, Patient Position: Sitting, Cuff Size: Large)   Pulse 99   Temp (!) 96.8 F (36 C) (Temporal)   Resp 18   Wt 245 lb (111.1 kg)   BMI 34.17 kg/m   General:  alert, cooperative and no distress  Pilonidal cyst area without drainage or significant erythema or induration.     Assessment:    Infected pilonidal cyst, resolving    Plan:   No need for surgical intervention at this time.  I did instruct the patient should he develop swelling in that area again, he should return to my office and then we would arrange for surgical excision.  He does realize that it may recur.  Follow-up as needed.

## 2020-01-18 ENCOUNTER — Ambulatory Visit: Payer: 59 | Admitting: Family Medicine

## 2020-05-01 ENCOUNTER — Emergency Department (HOSPITAL_COMMUNITY): Payer: 59

## 2020-05-01 ENCOUNTER — Ambulatory Visit
Admission: EM | Admit: 2020-05-01 | Discharge: 2020-05-01 | Disposition: A | Discharge status: Home / Self Care | Payer: 59 | Source: Home / Self Care

## 2020-05-01 ENCOUNTER — Other Ambulatory Visit: Payer: Self-pay

## 2020-05-01 ENCOUNTER — Emergency Department (HOSPITAL_COMMUNITY)
Admission: EM | Admit: 2020-05-01 | Discharge: 2020-05-01 | Disposition: A | Discharge status: Home / Self Care | Payer: 59 | Attending: Emergency Medicine | Admitting: Emergency Medicine

## 2020-05-01 DIAGNOSIS — S8011XA Contusion of right lower leg, initial encounter: Secondary | ICD-10-CM

## 2020-05-01 DIAGNOSIS — F17210 Nicotine dependence, cigarettes, uncomplicated: Secondary | ICD-10-CM | POA: Insufficient documentation

## 2020-05-01 DIAGNOSIS — W19XXXA Unspecified fall, initial encounter: Secondary | ICD-10-CM | POA: Diagnosis not present

## 2020-05-01 DIAGNOSIS — Y999 Unspecified external cause status: Secondary | ICD-10-CM | POA: Diagnosis not present

## 2020-05-01 DIAGNOSIS — Y92009 Unspecified place in unspecified non-institutional (private) residence as the place of occurrence of the external cause: Secondary | ICD-10-CM | POA: Insufficient documentation

## 2020-05-01 DIAGNOSIS — Y939 Activity, unspecified: Secondary | ICD-10-CM | POA: Diagnosis not present

## 2020-05-01 NOTE — ED Triage Notes (Signed)
Pt reports RLE pain since falling through a hay trailer on 04/19/20. Pt reports bruising on right ankle for last several days and reports right calf pain at rest with intermittent swelling. No obvious deformity noted at this time. Pt able to bear weight and ambulated with steady gait.

## 2020-05-01 NOTE — Discharge Instructions (Signed)
Your ultrasound today showed a small hematoma in the right calf area. This is a collection of blood on the skin that will go away with time Apply warm compresses and an ACE wrap for compression as needed Elevate the leg at night to help with swelling

## 2020-05-01 NOTE — ED Provider Notes (Signed)
[facility] EMERGENCY DEPARTMENT Provider Note   CSN: [id number] Arrival date & time: 05/01/20  0258     History Chief Complaint  Patient presents with  . Leg Pain    Luis Jarvis is a 29 y.o. male who presents with right leg pain and swelling.  Patient states that he fell through a trailer on June 16 and then fell backwards.  He is having right calf pain and ankle pain but did not get checked out because he thought it would get better.  Pain is been overall improving but he reports ongoing swelling in the right calf.  It is worse when he is on his feet all day and better when he rests.  He has been able to walk.  He has a history of bilateral ankle fractures and has mild ankle pain as well.  Patient's wife wanted him to get checked out so he went to urgent care and provider felt like he needed to come to the ED to have a blood clot ruled out.  HPI     Past Medical History:  Diagnosis Date  . Fx ankle   . TBI (traumatic brain injury) [facility])     Patient Active Problem List   Diagnosis Date Noted  . Anal warts 12/01/2015  . Fatigue 12/01/2015  . Closed low lateral malleolus fracture 07/21/2013    Past Surgical History:  Procedure Laterality Date  . TONSILLECTOMY    . WISDOM TOOTH EXTRACTION         No family history on file.  Social History   Tobacco Use  . Smoking status: Current Some Day Smoker    Types: Cigarettes  . Smokeless tobacco: Never Used  Substance Use Topics  . Alcohol use: Yes  . Drug use: No    Home Medications Prior to Admission medications   Not on File    Allergies    Patient has no known allergies.  Review of Systems   Review of Systems  Respiratory: Negative for shortness of breath.   Cardiovascular: Positive for leg swelling. Negative for chest pain.  Musculoskeletal: Positive for arthralgias and myalgias.  Skin: Negative for wound.  All other systems reviewed and are negative.   Physical Exam Updated Vital Signs BP (!)  143/84 (BP Location: Right Arm)   Pulse 71   Temp 98.4 F (36.9 C) (Oral)   Resp 18   Ht 6' (1.829 m)   Wt 104.3 kg   SpO2 98%   BMI 31.19 kg/m   Physical Exam Vitals and nursing note reviewed.  Constitutional:      General: He is not in acute distress.    Appearance: Normal appearance. He is well-developed. He is not ill-appearing.  HENT:     Head: Normocephalic and atraumatic.  Eyes:     General: No scleral icterus.       Right eye: No discharge.        Left eye: No discharge.     Conjunctiva/sclera: Conjunctivae normal.     Pupils: Pupils are equal, round, and reactive to light.  Cardiovascular:     Rate and Rhythm: Normal rate.  Pulmonary:     Effort: Pulmonary effort is normal. No respiratory distress.  Abdominal:     General: There is no distension.  Musculoskeletal:     Cervical back: Normal range of motion.     Comments: Right leg is mildly swollen compared to left leg. Normal ROM of the knee and ankle. No focal tenderness noted. 2+  DP pulses bilaterally  Skin:    General: Skin is warm and dry.  Neurological:     Mental Status: He is alert and oriented to person, place, and time.  Psychiatric:        Behavior: Behavior normal.     ED Results / Procedures / Treatments   Labs (all labs ordered are listed, but only abnormal results are displayed) Labs Reviewed - No data to display  EKG None  Radiology US Venous Img Lower Unilateral Right  Result Date: 05/01/2020 CLINICAL DATA:  Right lower extremity pain and edema EXAM: RIGHT LOWER EXTREMITY VENOUS DOPPLER ULTRASOUND TECHNIQUE: Gray-scale sonography with graded compression, as well as color Doppler and duplex ultrasound were performed to evaluate the lower extremity deep venous systems from the level of the common femoral vein and including the common femoral, femoral, profunda femoral, popliteal and calf veins including the posterior tibial, peroneal and gastrocnemius veins when visible. The superficial  great saphenous vein was also interrogated. Spectral Doppler was utilized to evaluate flow at rest and with distal augmentation maneuvers in the common femoral, femoral and popliteal veins. COMPARISON:  None. FINDINGS: Contralateral Common Femoral Vein: Respiratory phasicity is normal and symmetric with the symptomatic side. No evidence of thrombus. Normal compressibility. Common Femoral Vein: No evidence of thrombus. Normal compressibility, respiratory phasicity and response to augmentation. Saphenofemoral Junction: No evidence of thrombus. Normal compressibility and flow on color Doppler imaging. Profunda Femoral Vein: No evidence of thrombus. Normal compressibility and flow on color Doppler imaging. Femoral Vein: No evidence of thrombus. Normal compressibility, respiratory phasicity and response to augmentation. Popliteal Vein: No evidence of thrombus. Normal compressibility, respiratory phasicity and response to augmentation. Calf Veins: No evidence of thrombus. Normal compressibility and flow on color Doppler imaging. Superficial Great Saphenous Vein: No evidence of thrombus. Normal compressibility. Venous Reflux:  None. Other Findings: Right mid calf area of injury demonstrates a small elongated hypoechoic area measuring 2.9 x 0.5 x 0.9 cm. No associated vascularity. Suspect small hematoma in the setting of trauma. IMPRESSION: Negative for right lower extremity DVT. Probable right mid calf small hematoma. Electronically Signed   By: Jerilynn Mages.  Shick M.D.   On: 05/01/2020 11:52   DG Knee Complete 4 Views Right  Result Date: 05/01/2020 CLINICAL DATA:  Right knee pain since an injury when a floor gave way several days ago. Pain, swelling and bruising. Initial encounter. EXAM: RIGHT KNEE - COMPLETE 4+ VIEW COMPARISON:  None. FINDINGS: No evidence of fracture, dislocation, or joint effusion. No evidence of arthropathy or other focal bone abnormality. Soft tissues are unremarkable. IMPRESSION: Normal exam.  Electronically Signed   By: Inge Rise M.D.   On: 05/01/2020 10:37   DG Foot Complete Right  Result Date: 05/01/2020 CLINICAL DATA:  Pain and swelling and bruising of the right foot after falling through the floor of a trailer several days ago. EXAM: RIGHT FOOT COMPLETE - 3+ VIEW COMPARISON:  Radiographs dated 02/18/2009 FINDINGS: There is no evidence of fracture or dislocation. Moderate arthritic changes of the ankle joint which are new since 2010. The other bones and joints demonstrate no significant abnormalities. IMPRESSION: No acute abnormalities.  Moderate arthritis at the ankle joint. Electronically Signed   By: Lorriane Shire M.D.   On: 05/01/2020 10:36    Procedures Procedures (including critical care time)  Medications Ordered in ED Medications - No data to display  ED Course  I have reviewed the triage vital signs and the nursing notes.  Pertinent labs & imaging results  that were available during my care of the patient were reviewed by me and considered in my medical decision making (see chart for details).  29 year old male presents with ongoing right calf swelling after an injury that occurred over 2 weeks ago.  His vital signs are normal.  He has no complaints of chest pain or shortness of breath.  He states the swelling improves when he elevates it and it is worse when he is up on his feet all day.  He was essentially sent to the ED to rule out a blood clot.  The leg is mildly more edematous than the left.  He has intact distal pulses.  X-rays were obtained in triage and show no acute bony abnormality though he does have moderate arthritis in the ankle likely due to prior ankle fracture.  DVT study was obtained and shows no evidence of DVT but does show a small hematoma in the calf which is likely the cause of his swelling.  Advised conservative management, heat, Ace wrap as needed and elevate.  Reassurance was given.  MDM Rules/Calculators/A&P                            Final Clinical Impression(s) / ED Diagnoses Final diagnoses:  Hematoma of right lower leg    Rx / DC Orders ED Discharge Orders    None       Bethel Born, PA-C 05/01/20 1213    Terald Sleeper, MD 05/01/20 [phone]

## 2020-05-01 NOTE — ED Triage Notes (Signed)
Pt injured right leg 2 weeks ago after leg went through trailer floor and then fell backward. Pt ambulates ok but right calf is swollen and painful to touch. Concerned for blood clot. Provider made aware. Pt will go to ED to rule out blood clot

## 2020-10-18 ENCOUNTER — Encounter: Payer: Self-pay | Admitting: Family Medicine

## 2020-10-18 ENCOUNTER — Ambulatory Visit: Payer: 59 | Admitting: Family Medicine

## 2020-10-18 ENCOUNTER — Other Ambulatory Visit: Payer: Self-pay

## 2020-10-18 VITALS — BP 128/80 | HR 99 | Temp 98.0°F | Ht 72.0 in | Wt 248.0 lb

## 2020-10-18 DIAGNOSIS — F439 Reaction to severe stress, unspecified: Secondary | ICD-10-CM

## 2020-10-18 DIAGNOSIS — J989 Respiratory disorder, unspecified: Secondary | ICD-10-CM | POA: Diagnosis not present

## 2020-10-18 DIAGNOSIS — H9202 Otalgia, left ear: Secondary | ICD-10-CM

## 2020-10-18 DIAGNOSIS — R590 Localized enlarged lymph nodes: Secondary | ICD-10-CM

## 2020-10-18 DIAGNOSIS — L0591 Pilonidal cyst without abscess: Secondary | ICD-10-CM | POA: Diagnosis not present

## 2020-10-18 MED ORDER — AMOXICILLIN-POT CLAVULANATE 875-125 MG PO TABS: 1.0000 | ORAL_TABLET | Freq: Two times a day (BID) | ORAL | 0 refills | Status: DC

## 2020-10-18 NOTE — Patient Instructions (Signed)
  This is some additional information regarding Covid vaccine.  The Covid vaccine-specifically Pfizer and Moderna-help program the body to help recognize Covid to lessen the chance of getting sick with Covid plus also dramatically lessen the risk of a severe outcome.  These vaccines require 2 shots spaced several weeks apart.  The vaccine utilizes mRNA technology and does not interact with our DNA.  The research for these vaccines started back in the 1990s with mRNA research and technology.  The use of mRNA technology for vaccines was actually developed back during SARS infection of [place] in 2005.  Because this virus did not go worldwide there was never a need to utilize a vaccine.  The vaccine simply instructs our immune system how to recognize Covid virus thus lessening our risk of severe illness.  The studies show that vaccines are generally very well-tolerated.  It is common to have some mild side effects such as soreness at the site of the injection, for some individuals low-grade fever body aches headaches nausea diarrhea not feeling good for 2 to 3 days after the injection.  These moderate side effects are more likely to occur with the second shot.  Within 2 weeks of completing the second shot antibody levels are at a good level. Study showed that since June 2020 90% of those hospitalized for Covid were unvaccinated.  96% of those who died of Covid were unvaccinated.  The risk of dying from Covid for those who are vaccinated and under age 50 approaches 0%.  Most people who get sick with Covid will gradually get better but there is a randomness to the illness in which even younger individuals can have a severe case and end up in the hospital with Covid.  It is true that the older we are, Covid poses more risk.  Unfortunately some of the individuals who have severe cases or die from Covid have very little underlying health issues and are not considered "elderly ".1 in 7 individuals who have  Covid continue to have some symptoms of long-term Covid.  Sometimes this can be altered smell.  There are some this can result with long-term fatigue tiredness or headaches.  Also it has been shown.  Scientific studies that Covid can trigger heart attacks and strokes.  Having a Covid vaccine will not protect one against all Covid infections.  Approximately 20 to 30% of individuals who have had vaccines may get a amount of breakthrough case.  The way I look upon this-most of us drive cars that have seatbelts and in some cases airbags.  Every day we drive, we take a risk of potentially being in the traffic accident.  Having a seatbelt and airbag does not mean that we will not get in a traffic accident.  But having a seatbelt and the airbag can dramatically lower our risk of a severe outcome that could put us in the hospital or cause us to die from a auto accident.  In the same way having the Covid vaccine will lessen our risk of getting sick with Covid but if we are unlucky enough to get Covid the chance of being hospitalized or dying from it is tremendously lower.  So just as I would not feel good about disengaging my seatbelt and disabling my airbag-I would not feel good about living in a pandemic and not having the vaccine.  It is not unusual for viruses to mutate and therefore it is not unusual to have to sometimes redesign vaccines or get   a booster in order to maintain effectiveness of the vaccine.  I have had several individuals who have stated that they are not around others and so therefore they do not need to get the vaccine.  The difficult part is-this virus can infect some without triggering symptoms-therefore we can get exposed by being around a person who looks healthy-yet they are spreading the virus.  Every person is at risk of getting Covid.  I certainly recognize that many patients have difficulty deciding whether or not to do the vaccine.  Unfortunately there is a lot of information being  pushed forward on the news media, social media such as Facebook, and discussion of all friends and family that can be confusing for the average person to figure out.  It is important to realize that this information that we are exposed to falls into 3 categories. Category #1-on target information-this is typically information that is shown by scientific studies, or stated forth by experts within their fields. Category #2-misinformation-this is information that is well intended but is not an accurate reflection of what is really the case. Category #3-dis-information-this is information that is purposefully off base.  It is designed to confuse and discourage an individual from making a proper decision.  Through the past year and a half I have heard many different reasons why people do not get Covid vaccines.  Some of these reasons are a reflection of a person's deeply held beliefs.  Other reasons are based upon misinformation propagated in social media.  When making this decision it is extremely important to weed through all the noise!   Through my years of training, it has helped me to look at public health issues and treatment options from a medical perspective.  It is my firm belief that this vaccine is safe (I personally favor Moderna but Pfizer vaccine is also good).  Please do not underestimate this virus.  It is my sincere wish that all of our patients stay healthy and do not suffer tragic outcomes from this virus.  Please consider getting the vaccine if you have not already done so.  If you should have further questions please let us know.  Thanks-Dr. Casondra Gasca  

## 2020-10-18 NOTE — Progress Notes (Signed)
   Subjective:    Patient ID: Luis Jarvis, male    DOB: [birth year], 29 y.o.   MRN: [id number]  Otalgia  There is pain in the left ear. This is a new problem. Episode onset: one week. He has tried nothing for the symptoms.   Patient with pain discomfort in his left ear also the cervical lymphadenopathy on the left side.  In addition to this he denies any high fever chills or sweats  Patient relates separation from his wife going through the stress trying to deal with it as best he can Pilonidal cyst we given him trouble on the left side.  Some discomfort has had this happen before was seen by general surgery they were told that if it happens again they would consider surgery Review of Systems  HENT: Positive for ear pain.        Objective:   Physical Exam Eardrums are normal minimal cervical lymphadenopathy noted on the left side does not point toward cancer points more toward reactive lymphadenopathy related to recent illness throat is normal lungs are clear heart regular pilonidal cyst tenderness noted on the superior portion of the upper mid buttock       Assessment & Plan:  1. Pilonidal cyst Referral to general surgery await their input recommend warm compresses frequently as well as antibiotics - Ambulatory referral to General Surgery  2. Stress Stress related to separation patient is working with this is best he can  3. Cervical lymphadenopathy Due to recent respiratory illness should gradually go away if it does not go away in the next few weeks to notify us in follow-up  4. Respiratory illness Recent respiratory illness resolving  5. Otalgia, left No ear infection noted

## 2020-10-20 ENCOUNTER — Ambulatory Visit: Payer: 59 | Admitting: Family Medicine

## 2020-10-23 ENCOUNTER — Telehealth: Payer: Self-pay

## 2020-10-23 DIAGNOSIS — L0591 Pilonidal cyst without abscess: Secondary | ICD-10-CM

## 2020-10-23 NOTE — Telephone Encounter (Signed)
Please try to get him in with general surgery ASAP, [facility] or Dr. Lovell Sheehan Dr. Henreitta Leber office.  Please see if we can get him an appointment for this week by calling today to get this set up-given that it is a holiday week I am concerned if we do not set up the appointment today he will not be seen this week.

## 2020-10-23 NOTE — Telephone Encounter (Signed)
Patient scheduled with Dr Larae Grooms 10/24/20 at 1:15pm- Patient advised to continue antibiotics and notified of appointment. Patient verbalized understanding.

## 2020-10-23 NOTE — Telephone Encounter (Signed)
Cysts is not draining- has gotten bigger and is painful- no fever

## 2020-10-23 NOTE — Telephone Encounter (Signed)
Pt is still having trouble with Cyst it has gotten bigger and hurts a lot  Pt call back [phone]

## 2020-10-24 ENCOUNTER — Other Ambulatory Visit: Payer: Self-pay

## 2020-10-24 ENCOUNTER — Ambulatory Visit (INDEPENDENT_AMBULATORY_CARE_PROVIDER_SITE_OTHER): Payer: 59 | Admitting: General Surgery

## 2020-10-24 ENCOUNTER — Other Ambulatory Visit: Payer: Self-pay | Admitting: Family Medicine

## 2020-10-24 VITALS — BP 127/82 | HR 91 | Temp 98.7°F | Resp 16 | Ht 72.0 in | Wt 254.0 lb

## 2020-10-24 DIAGNOSIS — L0591 Pilonidal cyst without abscess: Secondary | ICD-10-CM | POA: Insufficient documentation

## 2020-10-24 NOTE — Progress Notes (Signed)
[facility] History and Physical  Reason for Referral: Pilonidal cyst with infection  Referring Physician: Dr. Gerda Diss   Chief Complaint    New Patient (Initial Visit)      Luis Jarvis is a 29 y.o. male.  HPI: Luis Jarvis is a 29 yo with prior history of pilonidal that was infected and drained in 2019. He says with antibiotics this got better and has not caused any issues since that time. He reports that he started having swelling and pain in the area a few days ago. He is on antibiotics now and it ruptured last night which has improved his pain. 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    Past Medical History:  Diagnosis Date  . Fx ankle   . TBI (traumatic brain injury) [facility])     Past Surgical History:  Procedure Laterality Date  . TONSILLECTOMY    . WISDOM TOOTH EXTRACTION      History reviewed. No pertinent family history.  Social History   Tobacco Use  . Smoking status: Current Some Day Smoker    Types: Cigarettes  . Smokeless tobacco: Never Used  Substance Use Topics  . Alcohol use: Yes  . Drug use: No    Medications: I have reviewed the patient's current medications. Allergies as of 10/24/2020   No Known Allergies     Medication List       Accurate as of October 24, 2020 11:59 PM. If you have any questions, ask your nurse or doctor.        amoxicillin-clavulanate 875-125 MG tablet Commonly known as: AUGMENTIN Take 1 tablet by mouth 2 (two) times daily.        ROS:  A comprehensive review of systems was negative except for: Musculoskeletal: positive for back pain drainage from cyst on lower back/ upper gluteal cleft  Blood pressure 127/82, pulse 91, temperature 98.7 F (37.1 C), temperature source Oral, resp. rate 16, height 6' (1.829 m), weight 254 lb (115.2 kg), SpO2 95 %. Physical Exam Vitals reviewed.  HENT:     Head: Normocephalic.     Nose: Nose normal.  Eyes:     Extraocular  Movements: Extraocular movements intact.  Cardiovascular:     Rate and Rhythm: Normal rate and regular rhythm.  Pulmonary:     Effort: Pulmonary effort is normal.     Breath sounds: Normal breath sounds.  Abdominal:     General: There is no distension.     Palpations: Abdomen is soft.     Tenderness: There is no abdominal tenderness.  Genitourinary:    Comments: Gluteal cleft with what seems like should be a pit but hair not pulling out, hairy back side, right of midline fluctuant area, no opening or drainage currently, tender Musculoskeletal:        General: Normal range of motion.     Cervical back: Normal range of motion.  Skin:    General: Skin is warm.  Neurological:     General: No focal deficit present.     Mental Status: He is alert and oriented to person, place, and time.  Psychiatric:        Mood and Affect: Mood normal.        Behavior: Behavior normal.        Thought Content: Thought content normal.        Judgment: Judgment normal.     Results: None   Pre-procedure diagnosis: Infected pilonidal cyst  Post procedure diagnosis: Infected pilonidal cyst versus sebaceous cyst   Procedure: Incision and drainage of abscess, simple   Description: The gluteal cleft area was cleaned with betadine and inspected. An area right of midline was fluctuant and not draining. 1% xylocaine with epinephrine was injected. A 11 blade was used to make an incision over the area. Some minor purulent fluid came out and possible keratin type substance. Edges of incision looked more like possible sebaceous type cyst.    Assessment & Plan:  Luis Jarvis is a 29 y.o. male with an infected cyst. Highly likely that this is a pilonidal based on his gluteal cleft and hair, but no hair was able to be removed from a pit and the inside of the I&D site looked almost keratin like a sebaceous cyst.   Keep area clean. Sitz baths after BMs. Tylenol and ibuprofen for pain. Expect drainage. Out of  work from 12/21 until 12/28 when you will follow up and determine return to work date.   Might be a pilonidal versus might just be a simple sebaceous type cyst based on the way it looked when it was drained.   We discussed that pilonidal cysts/ sinuses are abnormal tissue under the skin that is prone to infections. It is more common in patients that are overweight, family history of a pilonidal cyst, deep gluteal cleft and thicker body hair, especially in the region of the gluteal cleft. We discussed that the exact cause of the disease is unknown but could be related to ingrown hairs/ and inflammation in the area coupled with mechanical / shear forces. We discussed that these cysts and sinuses can be surgically removed but that they often recur.  Prior to surgery, options for management include keeping the area clean by removal of hair from the area to decrease bacterial load and prevent trapping of feces in the area, cleansing the area after bowel movements, and daily to twice daily showering as well as keeping the area dry and wicking away moisture from the area if needed with dry gauze replaced often.  Also discussed pulling hair from the pits as this is one cause of the sinus getting clogged and infected.    We discussed that surgery has a high risk of recurrence (as high as 50%). We discussed that is reserved for symptomatic people who fail medical management. We discussed that the surgery historically has consisted of removing large areas of tissue and that this leaves large wounds to heal or pack. The healing of this area and a wound can take months and the need for a reoperation is possible.  We discussed newer techniques for pilonidal cyst removal including the minimal invasive, "GIPS Procedure," which results in better cosmesis and decreased wound issues.   We discussed the risk of surgery including but not limited to bleeding, infection, open punch biopsy sites that are sometimes flushed with  peroxide, possible need for reoperation due to cyst recurrence.     Future Appointments  Date Time Provider Department Center  10/31/2020  1:15 PM Lucretia Roers, MD [facility] None    All questions were answered to the satisfaction of the patient.    Lucretia Roers 10/26/2020, 10:33 AM

## 2020-10-24 NOTE — Patient Instructions (Addendum)
Keep area clean. Sitz baths after BMs. Tylenol and ibuprofen for pain. Expect drainage. Out of work from 12/21 until 12/28 when you will follow up and determine return to work date.   Might be a pilonidal versus might just be a simple sebaceous type cyst based on the way it looked when it was drained.    Pilonidal Cyst Information   Pilonidal cysts/ sinuses are abnormal tissue under the skin that is prone to infections. It is more common in patients that are overweight, have a family history of a pilonidal cyst, have a deep gluteal cleft (butt crack) and thicker body hair, especially thicker hair in the region of the gluteal cleft. The exact cause of the disease is unknown but it could be related to ingrown hairs and inflammation in the area of the gluteal cleft coupled with mechanical and shear forces (butt crack rubbing together).  These cyst and sinuses are often associated with pits (small openings) that have hair coming out of them.  These cysts and sinuses can be surgically removed but they often recur and the recurrence can be has high as 50% of the the time.    Prior to surgery, options for management include keeping the area clean.  You should remove hair from the gluteal cleft area to decrease the bacterial load and prevent trapping of feces in the area as well as cleaning the area after bowel movements (showering/ washing off), and performing daily to twice daily showering. Removing the hair from the pits on a regular basis with tweezers can also prevent the sinus from getting clogged and infected.  In addition, keeping the area dry and wicking away moisture from the area if needed with dry gauze that is replaced often is also an option.    Any surgical procedure for the pilonidal cyst/ sinus has a high risk of recurrence (as high as 50%). Surgery is reserved for symptomatic people who fail medical management (above regimen). Surgery historically has consisted of removing large areas of  tissue from the gluteal cleft region, which creates large wounds to heal or pack. The healing of a wound in this area can take months and the need for a reoperation is possible.  Newer techniques for pilonidal cyst removal including the minimally invasive, "GIPS Procedure," which results in better cosmesis and decreased wound issues.  You would have to flush the biopsy sites with peroxide after this surgery. You can google GIPS procedure, and see Youtube videos.   The risk of any surgery for a pilonidal cyst includes but is not limited to bleeding, infection, re-operation, and recurrence of the cyst.

## 2020-10-26 ENCOUNTER — Encounter: Payer: Self-pay | Admitting: General Surgery

## 2020-10-31 ENCOUNTER — Other Ambulatory Visit: Payer: Self-pay | Admitting: Family Medicine

## 2020-10-31 ENCOUNTER — Ambulatory Visit (INDEPENDENT_AMBULATORY_CARE_PROVIDER_SITE_OTHER): Payer: 59 | Admitting: General Surgery

## 2020-10-31 ENCOUNTER — Other Ambulatory Visit: Payer: Self-pay

## 2020-10-31 ENCOUNTER — Encounter: Payer: Self-pay | Admitting: General Surgery

## 2020-10-31 VITALS — BP 125/83 | HR 89 | Temp 98.7°F | Resp 16 | Ht 72.0 in | Wt 251.0 lb

## 2020-10-31 DIAGNOSIS — L0591 Pilonidal cyst without abscess: Secondary | ICD-10-CM

## 2020-10-31 NOTE — Progress Notes (Signed)
[facility] Note   HPI:  29 y.o. Male presents to clinic for post-op follow-up evaluation after I&D of the infected cyst/ pilonidal cyst. He is doing well and having less drainage.  Review of Systems:  No fever or chills Less drainage All other review of systems: otherwise negative   Vital Signs:  BP 125/83   Pulse 89   Temp 98.7 F (37.1 C) (Oral)   Resp 16   Ht 6' (1.829 m)   Wt 251 lb (113.9 kg)   SpO2 96%   BMI 34.04 kg/m    Physical Exam:  Physical Exam Vitals reviewed.  Cardiovascular:     Rate and Rhythm: Normal rate.  Pulmonary:     Effort: Pulmonary effort is normal.  Genitourinary:    Comments: Left gluteal fold with less induration and drainage, small I&D open remains   Assessment:  29 y.o. yo Male with I&D of infected cyst/ pilonidal cyst. Doing well and improving. Finishing up his antibiotics. Discussed that this could be a regular cyst or pilonidal based on the contents with I&D. Discussed pilonidal treatment and hair removal again.   Plan:  Return to work without precautions 11/08/2019 Will see back in a few weeks to discuss any surgical options    Future Appointments  Date Time Provider Department Center  11/16/2020  3:30 PM Lucretia Roers, MD [facility] None     Algis Greenhouse, MD [facility] [address] [place], [place] [address] [phone] (office)

## 2020-10-31 NOTE — Patient Instructions (Signed)
Pilonidal Cyst Information   Pilonidal cysts/ sinuses are abnormal tissue under the skin that is prone to infections. It is more common in patients that are overweight, have a family history of a pilonidal cyst, have a deep gluteal cleft (butt crack) and thicker body hair, especially thicker hair in the region of the gluteal cleft. The exact cause of the disease is unknown but it could be related to ingrown hairs and inflammation in the area of the gluteal cleft coupled with mechanical and shear forces (butt crack rubbing together).  These cyst and sinuses are often associated with pits (small openings) that have hair coming out of them.  These cysts and sinuses can be surgically removed but they often recur and the recurrence can be has high as 50% of the the time.    Prior to surgery, options for management include keeping the area clean.  You should remove hair from the gluteal cleft area to decrease the bacterial load and prevent trapping of feces in the area as well as cleaning the area after bowel movements (showering/ washing off), and performing daily to twice daily showering. Removing the hair from the pits on a regular basis with tweezers can also prevent the sinus from getting clogged and infected.  In addition, keeping the area dry and wicking away moisture from the area if needed with dry gauze that is replaced often is also an option.    Any surgical procedure for the pilonidal cyst/ sinus has a high risk of recurrence (as high as 50%). Surgery is reserved for symptomatic people who fail medical management (above regimen). Surgery historically has consisted of removing large areas of tissue from the gluteal cleft region, which creates large wounds to heal or pack. The healing of a wound in this area can take months and the need for a reoperation is possible.  Newer techniques for pilonidal cyst removal including the minimally invasive, "GIPS Procedure," which results in better cosmesis and  decreased wound issues.  You would have to flush the biopsy sites with peroxide after this surgery. You can google GIPS procedure, and see Youtube videos.   The risk of any surgery for a pilonidal cyst includes but is not limited to bleeding, infection, re-operation, and recurrence of the cyst.

## 2020-11-16 ENCOUNTER — Ambulatory Visit: Payer: 59 | Admitting: General Surgery

## 2021-01-30 ENCOUNTER — Ambulatory Visit: Payer: 59 | Admitting: General Surgery

## 2021-01-30 ENCOUNTER — Other Ambulatory Visit: Payer: Self-pay | Admitting: Family Medicine

## 2021-01-30 ENCOUNTER — Other Ambulatory Visit: Payer: Self-pay

## 2021-01-30 ENCOUNTER — Encounter: Payer: Self-pay | Admitting: General Surgery

## 2021-01-30 VITALS — BP 125/83 | HR 83 | Temp 97.6°F | Resp 16 | Ht 72.0 in | Wt 252.0 lb

## 2021-01-30 DIAGNOSIS — L0591 Pilonidal cyst without abscess: Secondary | ICD-10-CM

## 2021-01-30 MED ORDER — AMOXICILLIN-POT CLAVULANATE 875-125 MG PO TABS: 1.0000 | ORAL_TABLET | Freq: Two times a day (BID) | ORAL | 0 refills | Status: DC

## 2021-01-30 NOTE — Patient Instructions (Addendum)
Keep area clean Try to remove any hair Take antibiotic as prescribed Sitz baths for comfort and after Bms  Pilonidal Cyst Information   Pilonidal cysts/ sinuses are abnormal tissue under the skin that is prone to infections. It is more common in patients that are overweight, have a family history of a pilonidal cyst, have a deep gluteal cleft (butt crack) and thicker body hair, especially thicker hair in the region of the gluteal cleft. The exact cause of the disease is unknown but it could be related to ingrown hairs and inflammation in the area of the gluteal cleft coupled with mechanical and shear forces (butt crack rubbing together).  These cyst and sinuses are often associated with pits (small openings) that have hair coming out of them.  These cysts and sinuses can be surgically removed but they often recur and the recurrence can be has high as 50% of the the time.    Prior to surgery, options for management include keeping the area clean.  You should remove hair from the gluteal cleft area to decrease the bacterial load and prevent trapping of feces in the area as well as cleaning the area after bowel movements (showering/ washing off), and performing daily to twice daily showering. Removing the hair from the pits on a regular basis with tweezers can also prevent the sinus from getting clogged and infected.  In addition, keeping the area dry and wicking away moisture from the area if needed with dry gauze that is replaced often is also an option.    Any surgical procedure for the pilonidal cyst/ sinus has a high risk of recurrence (as high as 50%). Surgery is reserved for symptomatic people who fail medical management (above regimen). Surgery historically has consisted of removing large areas of tissue from the gluteal cleft region, which creates large wounds to heal or pack. The healing of a wound in this area can take months and the need for a reoperation is possible.  Newer techniques for  pilonidal cyst removal including the minimally invasive, "GIPS Procedure," which results in better cosmesis and decreased wound issues.  You would have to flush the biopsy sites with peroxide after this surgery. You can google GIPS procedure, and see Youtube videos.   The risk of any surgery for a pilonidal cyst includes but is not limited to bleeding, infection, re-operation, and recurrence of the cyst.

## 2021-01-30 NOTE — Progress Notes (Signed)
[facility] History and Physical   Chief Complaint    Follow-up      Luis Jarvis is a 30 y.o. male.  HPI: Luis Jarvis is known to me and had a pilonidal cyst drained in the past. He has had this flare up and had to get it drained now several times. We discussed the option of hair removal and hygiene to prevent recurrences but he says he has not been removing the hair in the area. He is hurting now and the area opened up and drained some last night.  He reports he is ready to get something done.  Past Medical History:  Diagnosis Date  . Fx ankle   . TBI (traumatic brain injury) (HCC)     Past Surgical History:  Procedure Laterality Date  . TONSILLECTOMY    . WISDOM TOOTH EXTRACTION      History reviewed. No pertinent family history.  Social History   Tobacco Use  . Smoking status: Current Some Day Smoker    Types: Cigarettes  . Smokeless tobacco: Never Used  Substance Use Topics  . Alcohol use: Yes  . Drug use: No    Medications: I have reviewed the patient's current medications. Allergies as of 01/30/2021   No Known Allergies     Medication List       Accurate as of January 30, 2021 11:59 PM. If you have any questions, ask your nurse or doctor.        amoxicillin-clavulanate 875-125 MG tablet Commonly known as: Augmentin Take 1 tablet by mouth 2 (two) times daily for 7 days.   cetirizine 10 MG tablet Commonly known as: ZYRTEC Take 10 mg by mouth daily as needed for allergies.        ROS:  A comprehensive review of systems was negative except for: Musculoskeletal: positive for pain and drainge lower back/ gluteal cleft  Blood pressure 125/83, pulse 83, temperature 97.6 F (36.4 C), temperature source Other (Comment), resp. rate 16, height 6' (1.829 m), weight 252 lb (114.3 kg), SpO2 95 %. Physical Exam Vitals reviewed.  Constitutional:      Appearance: Normal appearance.  HENT:     Head: Normocephalic.     Nose: Nose normal.   Eyes:     Extraocular Movements: Extraocular movements intact.  Cardiovascular:     Rate and Rhythm: Normal rate and regular rhythm.  Pulmonary:     Effort: Pulmonary effort is normal.     Breath sounds: Normal breath sounds.  Abdominal:     General: There is no distension.     Palpations: Abdomen is soft.     Tenderness: There is no abdominal tenderness.  Genitourinary:    Comments: Gluteal cleft with right of midline raised area with drainage, small pit in midline but no hair to remove, cruciate incision made into the raised area and bloody fluid evacuated Musculoskeletal:        General: Normal range of motion.     Cervical back: Normal range of motion.  Skin:    General: Skin is warm.  Neurological:     General: No focal deficit present.     Mental Status: He is alert and oriented to person, place, and time.  Psychiatric:        Mood and Affect: Mood normal.        Behavior: Behavior normal.        Thought Content: Thought content normal.        Judgment: Judgment normal.       Results: None   Assessment & Plan:  Luis Jarvis is a 30 y.o. male with an infected pilonidal cyst that I had to drain again.   We discussed that pilonidal cysts/ sinuses are abnormal tissue under the skin that is prone to infections. It is more common in patients that are overweight, family history of a pilonidal cyst, deep gluteal cleft and thicker body hair, especially in the region of the gluteal cleft. We discussed that the exact cause of the disease is unknown but could be related to ingrown hairs/ and inflammation in the area coupled with mechanical / shear forces. We discussed that these cysts and sinuses can be surgically removed but that they often recur.  Prior to surgery, options for management include keeping the area clean by removal of hair from the area to decrease bacterial load and prevent trapping of feces in the area, cleansing the area after bowel movements, and daily to twice  daily showering as well as keeping the area dry and wicking away moisture from the area if needed with dry gauze replaced often.  Also discussed pulling hair from the pits as this is one cause of the sinus getting clogged and infected.    We discussed that surgery has a high risk of recurrence (as high as 50%). We discussed that is reserved for symptomatic people who fail medical management. We discussed that the surgery historically has consisted of removing large areas of tissue and that this leaves large wounds to heal or pack. The healing of this area and a wound can take months and the need for a reoperation is possible.  We discussed newer techniques for pilonidal cyst removal including the minimal invasive, "GIPS Procedure," which results in better cosmesis and decreased wound issues.   We discussed the risk of surgery including but not limited to bleeding, infection, open punch biopsy sites that are sometimes flushed with peroxide, possible need for reoperation due to cyst recurrence.   He wants to proceed and realizes the risk of recurrence and need to remove his hair in the area.   COVID preop discussed. Augmentin sent to the pharmacy   All questions were answered to the satisfaction of the patient.   Lucretia Roers 01/31/2021, 3:54 PM

## 2021-01-31 ENCOUNTER — Encounter: Payer: Self-pay | Admitting: General Surgery

## 2021-01-31 NOTE — H&P (Signed)
[facility] History and Physical   Chief Complaint    Follow-up      Luis Jarvis is a 30 y.o. male.  HPI: [name] is known to me and had a pilonidal cyst drained in the past. He has had this flare up and had to get it drained now several times. We discussed the option of hair removal and hygiene to prevent recurrences but he says he has not been removing the hair in the area. He is hurting now and the area opened up and drained some last night.  He reports he is ready to get something done.  Past Medical History:  Diagnosis Date  . Fx ankle   . TBI (traumatic brain injury) [facility])     Past Surgical History:  Procedure Laterality Date  . TONSILLECTOMY    . WISDOM TOOTH EXTRACTION      History reviewed. No pertinent family history.  Social History   Tobacco Use  . Smoking status: Current Some Day Smoker    Types: Cigarettes  . Smokeless tobacco: Never Used  Substance Use Topics  . Alcohol use: Yes  . Drug use: No    Medications: I have reviewed the patient's current medications. Allergies as of 01/30/2021   No Known Allergies     Medication List       Accurate as of January 30, 2021 11:59 PM. If you have any questions, ask your nurse or doctor.        amoxicillin-clavulanate 875-125 MG tablet Commonly known as: Augmentin Take 1 tablet by mouth 2 (two) times daily for 7 days.   cetirizine 10 MG tablet Commonly known as: ZYRTEC Take 10 mg by mouth daily as needed for allergies.        ROS:  A comprehensive review of systems was negative except for: Musculoskeletal: positive for pain and drainge lower back/ gluteal cleft  Blood pressure 125/83, pulse 83, temperature 97.6 F (36.4 C), temperature source Other (Comment), resp. rate 16, height 6' (1.829 m), weight 252 lb (114.3 kg), SpO2 95 %. Physical Exam Vitals reviewed.  Constitutional:      Appearance: Normal appearance.  HENT:     Head: Normocephalic.     Nose: Nose normal.   Eyes:     Extraocular Movements: Extraocular movements intact.  Cardiovascular:     Rate and Rhythm: Normal rate and regular rhythm.  Pulmonary:     Effort: Pulmonary effort is normal.     Breath sounds: Normal breath sounds.  Abdominal:     General: There is no distension.     Palpations: Abdomen is soft.     Tenderness: There is no abdominal tenderness.  Genitourinary:    Comments: Gluteal cleft with right of midline raised area with drainage, small pit in midline but no hair to remove, cruciate incision made into the raised area and bloody fluid evacuated Musculoskeletal:        General: Normal range of motion.     Cervical back: Normal range of motion.  Skin:    General: Skin is warm.  Neurological:     General: No focal deficit present.     Mental Status: He is alert and oriented to person, place, and time.  Psychiatric:        Mood and Affect: Mood normal.        Behavior: Behavior normal.        Thought Content: Thought content normal.        Judgment: Judgment normal.  Results: None   Assessment & Plan:  [name] is a 30 y.o. male with an infected pilonidal cyst that I had to drain again.   We discussed that pilonidal cysts/ sinuses are abnormal tissue under the skin that is prone to infections. It is more common in patients that are overweight, family history of a pilonidal cyst, deep gluteal cleft and thicker body hair, especially in the region of the gluteal cleft. We discussed that the exact cause of the disease is unknown but could be related to ingrown hairs/ and inflammation in the area coupled with mechanical / shear forces. We discussed that these cysts and sinuses can be surgically removed but that they often recur.  Prior to surgery, options for management include keeping the area clean by removal of hair from the area to decrease bacterial load and prevent trapping of feces in the area, cleansing the area after bowel movements, and daily to twice  daily showering as well as keeping the area dry and wicking away moisture from the area if needed with dry gauze replaced often.  Also discussed pulling hair from the pits as this is one cause of the sinus getting clogged and infected.    We discussed that surgery has a high risk of recurrence (as high as 50%). We discussed that is reserved for symptomatic people who fail medical management. We discussed that the surgery historically has consisted of removing large areas of tissue and that this leaves large wounds to heal or pack. The healing of this area and a wound can take months and the need for a reoperation is possible.  We discussed newer techniques for pilonidal cyst removal including the minimal invasive, "GIPS Procedure," which results in better cosmesis and decreased wound issues.   We discussed the risk of surgery including but not limited to bleeding, infection, open punch biopsy sites that are sometimes flushed with peroxide, possible need for reoperation due to cyst recurrence.   He wants to proceed and realizes the risk of recurrence and need to remove his hair in the area.   COVID preop discussed. Augmentin sent to the pharmacy   All questions were answered to the satisfaction of the patient.   Lucretia Roers 01/31/2021, 3:54 PM

## 2021-01-31 NOTE — Patient Instructions (Signed)
Luis Jarvis  01/31/2021     @PREFPERIOPPHARMACY @   Your procedure is scheduled on  02/05/2021.   Report to 04/07/2021 at  [phone]  A.M.   Call this number if you have problems the morning of surgery:  [phone]   Remember:  Do not eat or drink after midnight.                         Take these medicines the morning of surgery with A SIP OF WATER  Zyrtec.  Place clean sheets on your bed the night before your procedure. DO NOT sleep with pets this night.  Shower with CHG the night and the morning of your procedure. DO NOT put CHG on your face, hair or genitals.  After each shower, dry off with a clean towel, put on clean, comfortable clothes and brush your teeth.      Do not wear jewelry, make-up or nail polish.  Do not wear lotions, powders, or perfumes, or deodorant.  Do not shave 48 hours prior to surgery.  Men may shave face and neck.  Do not bring valuables to the hospital.  [facility] is not responsible for any belongings or valuables.  Contacts, dentures or bridgework may not be worn into surgery.  Leave your suitcase in the car.  After surgery it may be brought to your room.  For patients admitted to the hospital, discharge time will be determined by your treatment team.  Patients discharged the day of surgery will not be allowed to drive home and must have someone with them for 24 hours.   Special instructions:  DO NOT smoke tobacco or vape the morning of your procedure.    Please read over the following fact sheets that you were given. Coughing and Deep Breathing, Surgical Site Infection Prevention, Anesthesia Post-op Instructions and Care and Recovery After Surgery       Pilonidal Cyst Drainage, Care After This sheet gives you information about how to care for yourself after your procedure. Your health care provider may also give you more specific instructions. If you have problems or questions, contact your health care provider. What can I  expect after the procedure? After the procedure, it is common to have:  Pain that gets better when you take medicine.  Some fluid or blood coming from your wound. Follow these instructions at home: Medicines  Take over-the-counter and prescription medicines only as told by your health care provider.  If you were prescribed an antibiotic medicine, take it as told by your health care provider. Do not stop taking the antibiotic even if you start to feel better. Lifestyle  Do not do activities that irritate or put pressure on your buttocks for about 2 weeks, or as long as told by your health care provider. These activities include bike riding, running, and anything that involves a twisting motion.  Do not sit for long periods at a time without getting up to move around.  Sleep on your side instead of your back.  Avoid wearing tight underwear and tight pants. Bathing  Do not take baths or showers, swim, or use a hot tub until your health care provider approves. This depends on the type of wound you have from surgery.  While bathing, clean your buttocks area gently with soap and water.  After bathing: ? Pat the area dry with a soft, clean towel. ? Cover the area with a clean  bandage (dressing), if told to by your health care provider. General instructions  If you are taking prescription pain medicine, take actions to prevent or treat constipation. Your health care provider may recommend that you: ? Drink enough fluid to keep your urine pale yellow. ? Eat foods that are high in fiber, such as fresh fruits and vegetables, whole grains, and beans. ? Limit foods that are high in fat and processed sugars, such as fried or sweet foods. ? Take an over-the-counter or prescription medicine for constipation.  You will need to have a caregiver help you manage wound care and dressing changes. Your caregiver should: ? Wash his or her hands with soap and water before changing your dressing. If  soap and water are not available, your caregiver should use hand sanitizer. ? Check your wound every day for signs of infection, such as:  Redness, swelling, or more pain.  More fluid or blood.  Warmth.  Pus or a bad smell. ? Follow any additional instructions from your health care provider on how to care for your wound, such as wound cleaning, wound flushing (irrigation), or packing your wound with a dressing.  Keep all follow-up visits as told by your health care provider. This is important.   If you had incision and drainage with wound packing:  Return to your health care provider as instructed to have your packing material changed or removed.  Keep the area dry until your packing has been removed.  After the packing has been removed, you may start taking showers. If you had marsupialization:  You may start taking showers the day after surgery, or when your health care provider approves.  Remove your dressing before you shower, but let the water from the shower moisten your dressing before you remove it. This will make it easier to remove.  Ask your health care provider when you can stop using a dressing. If you had incision and drainage without wound packing:  Change your dressing as directed.  Leave stitches (sutures), skin glue, or adhesive strips in place. These skin closures may need to stay in place for 2 weeks or longer. If adhesive strip edges start to loosen and curl up, you may trim the loose edges. Do not remove adhesive strips completely unless your health care provider tells you to do that. Contact a health care provider if:  You have redness, swelling, or more pain around your wound.  You have more fluid or blood coming from your wound.  You have new bleeding from your wound.  Your wound feels warm to the touch.  There is pus or a bad smell coming from your wound.  You have pain that does not get better with medicine.  You have a fever or chills.  You  have muscle aches.  You are dizzy.  You feel generally sick. Summary  After a procedure to drain a pilonidal cyst, it is common to have some fluid or blood coming from your wound.  If you were prescribed an antibiotic medicine, take it as told by your health care provider. Do not stop taking the antibiotic even if you start to feel better.  Return to your health care provider as instructed to have any packing material changed or removed. This information is not intended to replace advice given to you by your health care provider. Make sure you discuss any questions you have with your health care provider. Document Revised: 08/31/2020 Document Reviewed: 08/31/2020 Elsevier Patient Education  2021 [employer]. General  Anesthesia, Adult, Care After This sheet gives you information about how to care for yourself after your procedure. Your health care provider may also give you more specific instructions. If you have problems or questions, contact your health care provider. What can I expect after the procedure? After the procedure, the following side effects are common:  Pain or discomfort at the IV site.  Nausea.  Vomiting.  Sore throat.  Trouble concentrating.  Feeling cold or chills.  Feeling weak or tired.  Sleepiness and fatigue.  Soreness and body aches. These side effects can affect parts of the body that were not involved in surgery. Follow these instructions at home: For the time period you were told by your health care provider:  Rest.  Do not participate in activities where you could fall or become injured.  Do not drive or use machinery.  Do not drink alcohol.  Do not take sleeping pills or medicines that cause drowsiness.  Do not make important decisions or sign legal documents.  Do not take care of children on your own.   Eating and drinking  Follow any instructions from your health care provider about eating or drinking restrictions.  When you  feel hungry, start by eating small amounts of foods that are soft and easy to digest (bland), such as toast. Gradually return to your regular diet.  Drink enough fluid to keep your urine pale yellow.  If you vomit, rehydrate by drinking water, juice, or clear broth. General instructions  If you have sleep apnea, surgery and certain medicines can increase your risk for breathing problems. Follow instructions from your health care provider about wearing your sleep device: ? Anytime you are sleeping, including during daytime naps. ? While taking prescription pain medicines, sleeping medicines, or medicines that make you drowsy.  Have a responsible adult stay with you for the time you are told. It is important to have someone help care for you until you are awake and alert.  Return to your normal activities as told by your health care provider. Ask your health care provider what activities are safe for you.  Take over-the-counter and prescription medicines only as told by your health care provider.  If you smoke, do not smoke without supervision.  Keep all follow-up visits as told by your health care provider. This is important. Contact a health care provider if:  You have nausea or vomiting that does not get better with medicine.  You cannot eat or drink without vomiting.  You have pain that does not get better with medicine.  You are unable to pass urine.  You develop a skin rash.  You have a fever.  You have redness around your IV site that gets worse. Get help right away if:  You have difficulty breathing.  You have chest pain.  You have blood in your urine or stool, or you vomit blood. Summary  After the procedure, it is common to have a sore throat or nausea. It is also common to feel tired.  Have a responsible adult stay with you for the time you are told. It is important to have someone help care for you until you are awake and alert.  When you feel hungry, start  by eating small amounts of foods that are soft and easy to digest (bland), such as toast. Gradually return to your regular diet.  Drink enough fluid to keep your urine pale yellow.  Return to your normal activities as told by your health care provider.  Ask your health care provider what activities are safe for you. This information is not intended to replace advice given to you by your health care provider. Make sure you discuss any questions you have with your health care provider. Document Revised: 07/06/2020 Document Reviewed: 02/03/2020 Elsevier Patient Education  2021 [employer].

## 2021-02-02 ENCOUNTER — Encounter (HOSPITAL_COMMUNITY)
Admission: RE | Admit: 2021-02-02 | Discharge: 2021-02-02 | Disposition: A | Discharge status: Home / Self Care | Payer: 59 | Source: Ambulatory Visit | Attending: General Surgery | Admitting: General Surgery

## 2021-02-02 ENCOUNTER — Other Ambulatory Visit (HOSPITAL_COMMUNITY)
Admission: RE | Admit: 2021-02-02 | Discharge: 2021-02-02 | Disposition: A | Discharge status: Home / Self Care | Payer: 59 | Source: Ambulatory Visit | Attending: General Surgery | Admitting: General Surgery

## 2021-02-02 ENCOUNTER — Other Ambulatory Visit: Payer: Self-pay

## 2021-02-02 DIAGNOSIS — Z01812 Encounter for preprocedural laboratory examination: Secondary | ICD-10-CM | POA: Diagnosis not present

## 2021-02-02 DIAGNOSIS — Z20822 Contact with and (suspected) exposure to covid-19: Secondary | ICD-10-CM | POA: Diagnosis not present

## 2021-02-03 LAB — SARS CORONAVIRUS 2 (TAT 6-24 HRS): SARS Coronavirus 2: NEGATIVE

## 2021-02-05 ENCOUNTER — Encounter (HOSPITAL_COMMUNITY): Payer: Self-pay | Admitting: General Surgery

## 2021-02-05 ENCOUNTER — Ambulatory Visit (HOSPITAL_COMMUNITY): Payer: 59 | Admitting: Certified Registered"

## 2021-02-05 ENCOUNTER — Ambulatory Visit (HOSPITAL_COMMUNITY)
Admission: RE | Admit: 2021-02-05 | Discharge: 2021-02-05 | Disposition: A | Discharge status: Home / Self Care | Payer: 59 | Attending: General Surgery | Admitting: General Surgery

## 2021-02-05 ENCOUNTER — Encounter (HOSPITAL_COMMUNITY)
Admission: RE | Disposition: A | Discharge status: Home / Self Care | Payer: Self-pay | Source: Home / Self Care | Attending: General Surgery

## 2021-02-05 DIAGNOSIS — F17210 Nicotine dependence, cigarettes, uncomplicated: Secondary | ICD-10-CM | POA: Diagnosis not present

## 2021-02-05 DIAGNOSIS — L0591 Pilonidal cyst without abscess: Secondary | ICD-10-CM | POA: Diagnosis present

## 2021-02-05 HISTORY — PX: PILONIDAL CYST EXCISION: SHX744

## 2021-02-05 SURGERY — EXCISION, SIMPLE PILONIDAL CYST
Anesthesia: General

## 2021-02-05 MED ORDER — LIDOCAINE HCL (PF) 2 % IJ SOLN
INTRAMUSCULAR | Status: AC
  Filled 2021-02-05: qty 5

## 2021-02-05 MED ORDER — OXYCODONE HCL 5 MG PO TABS: 5.0000 mg | ORAL_TABLET | ORAL | 0 refills | Status: AC | PRN

## 2021-02-05 MED ORDER — CEFAZOLIN SODIUM-DEXTROSE 2-4 GM/100ML-% IV SOLN
INTRAVENOUS | Status: AC
  Filled 2021-02-05: qty 100

## 2021-02-05 MED ORDER — HYDROMORPHONE HCL 1 MG/ML IJ SOLN: 0.2500 mg | INTRAMUSCULAR | Status: DC | PRN

## 2021-02-05 MED ORDER — METHYLENE BLUE 0.5 % INJ SOLN
INTRAVENOUS | Status: AC
  Filled 2021-02-05: qty 10

## 2021-02-05 MED ORDER — FENTANYL CITRATE (PF) 100 MCG/2ML IJ SOLN
INTRAMUSCULAR | Status: AC
  Filled 2021-02-05: qty 2

## 2021-02-05 MED ORDER — DEXMEDETOMIDINE (PRECEDEX) IN NS 20 MCG/5ML (4 MCG/ML) IV SYRINGE
PREFILLED_SYRINGE | INTRAVENOUS | Status: AC
  Filled 2021-02-05: qty 5

## 2021-02-05 MED ORDER — DEXAMETHASONE SODIUM PHOSPHATE 10 MG/ML IJ SOLN
INTRAMUSCULAR | Status: AC
  Filled 2021-02-05: qty 2

## 2021-02-05 MED ORDER — CHLORHEXIDINE GLUCONATE CLOTH 2 % EX PADS: 6.0000 | MEDICATED_PAD | Freq: Once | CUTANEOUS | Status: DC

## 2021-02-05 MED ORDER — CEFAZOLIN SODIUM-DEXTROSE 2-4 GM/100ML-% IV SOLN
2.0000 g | INTRAVENOUS | Status: AC
  Administered 2021-02-05: 2 g via INTRAVENOUS

## 2021-02-05 MED ORDER — METHYLENE BLUE 0.5 % INJ SOLN
INTRAVENOUS | Status: DC | PRN
  Administered 2021-02-05: 1 mL via SUBMUCOSAL

## 2021-02-05 MED ORDER — SUCCINYLCHOLINE CHLORIDE 200 MG/10ML IV SOSY
PREFILLED_SYRINGE | INTRAVENOUS | Status: DC | PRN
  Administered 2021-02-05: 20 mg via INTRAVENOUS

## 2021-02-05 MED ORDER — CHLORHEXIDINE GLUCONATE 0.12 % MT SOLN
OROMUCOSAL | Status: AC
  Filled 2021-02-05: qty 15

## 2021-02-05 MED ORDER — CHLORHEXIDINE GLUCONATE 0.12 % MT SOLN: 15.0000 mL | Freq: Once | OROMUCOSAL | Status: DC

## 2021-02-05 MED ORDER — SUCCINYLCHOLINE CHLORIDE 200 MG/10ML IV SOSY
PREFILLED_SYRINGE | INTRAVENOUS | Status: AC
  Filled 2021-02-05: qty 10

## 2021-02-05 MED ORDER — DEXMEDETOMIDINE (PRECEDEX) IN NS 20 MCG/5ML (4 MCG/ML) IV SYRINGE
PREFILLED_SYRINGE | INTRAVENOUS | Status: DC | PRN
  Administered 2021-02-05 (×5): 4 ug via INTRAVENOUS

## 2021-02-05 MED ORDER — PROPOFOL 10 MG/ML IV BOLUS
INTRAVENOUS | Status: DC | PRN
  Administered 2021-02-05: 20 mg via INTRAVENOUS
  Administered 2021-02-05: 250 mg via INTRAVENOUS

## 2021-02-05 MED ORDER — SODIUM CHLORIDE 0.9 % IR SOLN
Status: DC | PRN
  Administered 2021-02-05: 1000 mL

## 2021-02-05 MED ORDER — BUPIVACAINE LIPOSOME 1.3 % IJ SUSP
INTRAMUSCULAR | Status: AC
  Filled 2021-02-05: qty 20

## 2021-02-05 MED ORDER — DEXAMETHASONE SODIUM PHOSPHATE 10 MG/ML IJ SOLN
INTRAMUSCULAR | Status: AC
  Filled 2021-02-05: qty 1

## 2021-02-05 MED ORDER — FENTANYL CITRATE (PF) 250 MCG/5ML IJ SOLN
INTRAMUSCULAR | Status: DC | PRN
  Administered 2021-02-05 (×6): 25 ug via INTRAVENOUS
  Administered 2021-02-05: 50 ug via INTRAVENOUS

## 2021-02-05 MED ORDER — PHENYLEPHRINE 40 MCG/ML (10ML) SYRINGE FOR IV PUSH (FOR BLOOD PRESSURE SUPPORT)
PREFILLED_SYRINGE | INTRAVENOUS | Status: DC | PRN
  Administered 2021-02-05: 80 ug via INTRAVENOUS

## 2021-02-05 MED ORDER — ROCURONIUM BROMIDE 10 MG/ML (PF) SYRINGE
PREFILLED_SYRINGE | INTRAVENOUS | Status: AC
  Filled 2021-02-05: qty 10

## 2021-02-05 MED ORDER — PROPOFOL 10 MG/ML IV BOLUS
INTRAVENOUS | Status: AC
  Filled 2021-02-05: qty 40

## 2021-02-05 MED ORDER — ORAL CARE MOUTH RINSE: 15.0000 mL | Freq: Once | OROMUCOSAL | Status: DC

## 2021-02-05 MED ORDER — BUPIVACAINE LIPOSOME 1.3 % IJ SUSP
INTRAMUSCULAR | Status: DC | PRN
  Administered 2021-02-05: 20 mL

## 2021-02-05 MED ORDER — ONDANSETRON HCL 4 MG PO TABS: 4.0000 mg | ORAL_TABLET | Freq: Three times a day (TID) | ORAL | 1 refills | Status: AC | PRN

## 2021-02-05 MED ORDER — MIDAZOLAM HCL 2 MG/2ML IJ SOLN
INTRAMUSCULAR | Status: AC
  Filled 2021-02-05: qty 2

## 2021-02-05 MED ORDER — LIDOCAINE 2% (20 MG/ML) 5 ML SYRINGE
INTRAMUSCULAR | Status: DC | PRN
  Administered 2021-02-05: 80 mg via INTRAVENOUS

## 2021-02-05 MED ORDER — ONDANSETRON HCL 4 MG/2ML IJ SOLN
INTRAMUSCULAR | Status: DC | PRN
  Administered 2021-02-05: 4 mg via INTRAVENOUS

## 2021-02-05 MED ORDER — ONDANSETRON HCL 4 MG/2ML IJ SOLN: 4.0000 mg | Freq: Once | INTRAMUSCULAR | Status: DC | PRN

## 2021-02-05 MED ORDER — DEXAMETHASONE SODIUM PHOSPHATE 10 MG/ML IJ SOLN
INTRAMUSCULAR | Status: DC | PRN
  Administered 2021-02-05: 8 mg via INTRAVENOUS

## 2021-02-05 MED ORDER — HYDROGEN PEROXIDE 3 % EX SOLN
CUTANEOUS | Status: DC | PRN
  Administered 2021-02-05: 1

## 2021-02-05 MED ORDER — ONDANSETRON HCL 4 MG/2ML IJ SOLN
INTRAMUSCULAR | Status: AC
  Filled 2021-02-05: qty 2

## 2021-02-05 MED ORDER — LACTATED RINGERS IV SOLN: INTRAVENOUS | Status: DC

## 2021-02-05 MED ORDER — MIDAZOLAM HCL 5 MG/5ML IJ SOLN
INTRAMUSCULAR | Status: DC | PRN
  Administered 2021-02-05: 2 mg via INTRAVENOUS

## 2021-02-05 SURGICAL SUPPLY — 26 items
CANNULA VESSEL 3MM 2 BLNT TIP (CANNULA) ×4 IMPLANT
CLOTH BEACON ORANGE TIMEOUT ST (SAFETY) ×2 IMPLANT
COVER LIGHT HANDLE STERIS (MISCELLANEOUS) ×4 IMPLANT
COVER WAND RF STERILE (DRAPES) ×2 IMPLANT
ELECT REM PT RETURN 9FT ADLT (ELECTROSURGICAL) ×2
ELECTRODE REM PT RTRN 9FT ADLT (ELECTROSURGICAL) ×1 IMPLANT
GAUZE SPONGE 4X4 12PLY STRL (GAUZE/BANDAGES/DRESSINGS) ×4 IMPLANT
GLOVE SURG ENC MOIS LTX SZ6.5 (GLOVE) ×2 IMPLANT
GLOVE SURG UNDER POLY LF SZ6.5 (GLOVE) ×2 IMPLANT
GLOVE SURG UNDER POLY LF SZ7 (GLOVE) ×4 IMPLANT
GOWN STRL REUS W/TWL LRG LVL3 (GOWN DISPOSABLE) ×4 IMPLANT
KIT TURNOVER KIT A (KITS) ×2 IMPLANT
MANIFOLD NEPTUNE II (INSTRUMENTS) ×2 IMPLANT
NEEDLE HYPO 21X1.5 SAFETY (NEEDLE) ×2 IMPLANT
NS IRRIG 1000ML POUR BTL (IV SOLUTION) ×2 IMPLANT
PACK MINOR (CUSTOM PROCEDURE TRAY) ×2 IMPLANT
PAD ABD 5X9 TENDERSORB (GAUZE/BANDAGES/DRESSINGS) ×4 IMPLANT
PAD ARMBOARD 7.5X6 YLW CONV (MISCELLANEOUS) ×2 IMPLANT
PENCIL SMOKE EVACUATOR (MISCELLANEOUS) ×2 IMPLANT
SET BASIN LINEN APH (SET/KITS/TRAYS/PACK) ×2 IMPLANT
SPONGE LAP 18X18 RF (DISPOSABLE) ×2 IMPLANT
SUT ETHILON 3 0 FSL (SUTURE) IMPLANT
SUT PROLENE 2 0 FS (SUTURE) IMPLANT
SYR 20ML LL LF (SYRINGE) ×4 IMPLANT
SYR CONTROL 10ML LL (SYRINGE) ×2 IMPLANT
TOWEL OR 17X26 4PK STRL BLUE (TOWEL DISPOSABLE) ×2 IMPLANT

## 2021-02-05 NOTE — Anesthesia Procedure Notes (Signed)
Procedure Name: LMA Insertion Date/Time: 02/05/2021 7:39 AM Performed by: Lucinda Dell, CRNA Pre-anesthesia Checklist: Patient identified, Emergency Drugs available, Patient being monitored and Suction available Patient Re-evaluated:Patient Re-evaluated prior to induction Oxygen Delivery Method: Circle system utilized Preoxygenation: Pre-oxygenation with 100% oxygen Induction Type: IV induction Ventilation: Mask ventilation without difficulty LMA: LMA inserted LMA Size: 5.0 Number of attempts: 1 Placement Confirmation: positive ETCO2 and breath sounds checked- equal and bilateral Tube secured with: Tape Dental Injury: Teeth and Oropharynx as per pre-operative assessment

## 2021-02-05 NOTE — Discharge Instructions (Signed)
Pilonidal Surgery Discharge Instructions:  Keep the area clean and shave hair from around the wounds. Irrigate each opening with peroxide daily. You have a special tip and syringe from the hospital.  Keep a dry pad over the area, and replace as needed for drainage.  Take showers daily and keep the area clean. Ok to let soap and water run over the area. Pat the area dry.  Clean area after bowel movements.   Pain Expectations and Narcotics: -After surgery you will have pain associated with your incisions and this is normal. The pain is muscular and nerve pain, and will get better with time. -You are encouraged and expected to take non narcotic medications like tylenol and ibuprofen (when able) to treat pain as multiple modalities can aid with pain treatment. -Narcotics are only used when pain is severe or there is breakthrough pain. -You are not expected to have a pain score of 0 after surgery, as we cannot prevent pain. A pain score of 3-4 that allows you to be functional, move, walk, and tolerate some activity is the goal. The pain will continue to improve over the days after surgery and is dependent on your surgery. -Due to [place] law, we are only able to give a certain amount of pain medication to treat post operative pain, and we only give additional narcotics on a patient by patient basis.  -For most laparoscopic surgery, studies have shown that the majority of patients only need 10-15 narcotic pills, and for open surgeries most patients only need 15-20.   -Having appropriate expectations of pain and knowledge of pain management with non narcotics is important as we do not want anyone to become addicted to narcotic pain medication.  -Using ice packs in the first 48 hours and heating pads after 48 hours, wearing an abdominal binder (when recommended), and using over the counter medications are all ways to help with pain management.   -Simple acts like meditation and mindfulness practices after surgery  can also help with pain control and research has proven the benefit of these practices.  Medication: Take tylenol and ibuprofen as needed for pain control, alternating every 4-6 hours.  Example:  Tylenol 1000mg  @ 6am, 12noon, 6pm, (Do not exceed 4000mg  of tylenol a day). Ibuprofen 800mg  @ 9am, 3pm, 9pm, 3am (Do not exceed 3600mg  of ibuprofen a day).  Take Roxicodone for breakthrough pain every 4 hours.  Take Colace for constipation related to narcotic pain medication. If you do not have a bowel movement in 2 days, take Miralax over the counter.  Drink plenty of water to also prevent constipation.   Contact Information: If you have questions or concerns, please call our office, [phone], Monday- Thursday 8AM-5PM and Friday 8AM-12Noon.  If it is after hours or on the weekend, please call [facility] Main Number, [phone], and ask to speak to the surgeon on call for Dr. Saturday at [facility].       [facility] THE [place] [facility] UNTIL Friday February 09, 2021. DO NOT USE NUMBING MEDICATIONS WITHOUT CONSULTING A PHYSICIAN UNTIL AFTER Friday  Bupivacaine Liposomal Suspension for Injection What is this medicine? BUPIVACAINE LIPOSOMAL (bue PIV a kane LIP oh som al) is an anesthetic. It causes loss of feeling in the skin or other tissues. It is used to prevent and to treat pain from some procedures. This medicine may be used for other purposes; ask your health care provider or pharmacist if you have questions. COMMON BRAND NAME(S): EXPAREL What should I  tell my health care provider before I take this medicine? They need to know if you have any of these conditions:  G6PD deficiency  heart disease  kidney disease  liver disease  low blood pressure  lung or breathing disease, like asthma  an unusual or allergic reaction to bupivacaine, other medicines, foods, dyes, or preservatives  pregnant or trying to get pregnant  breast-feeding How should I use this  medicine? This medicine is injected into the affected area. It is given by a health care provider in a hospital or clinic setting. Talk to your health care provider about the use of this medicine in children. While it may be given to children as young as 6 years for selected conditions, precautions do apply. Overdosage: If you think you have taken too much of this medicine contact a poison control center or emergency room at once. NOTE: This medicine is only for you. Do not share this medicine with others. What if I miss a dose? This does not apply. What may interact with this medicine? This medicine may interact with the following medications:  acetaminophen  certain antibiotics like dapsone, nitrofurantoin, aminosalicylic acid, sulfonamides  certain medicines for seizures like phenobarbital, phenytoin, valproic acid  chloroquine  cyclophosphamide  flutamide  hydroxyurea  ifosfamide  metoclopramide  nitric oxide  nitroglycerin  nitroprusside  nitrous oxide  other local anesthetics like lidocaine, pramoxine, tetracaine  primaquine  quinine  rasburicase  sulfasalazine This list may not describe all possible interactions. Give your health care provider a list of all the medicines, herbs, non-prescription drugs, or dietary supplements you use. Also tell them if you smoke, drink alcohol, or use illegal drugs. Some items may interact with your medicine. What should I watch for while using this medicine? Your condition will be monitored carefully while you are receiving this medicine. Be careful to avoid injury while the area is numb, and you are not aware of pain. What side effects may I notice from receiving this medicine? Side effects that you should report to your doctor or health care professional as soon as possible:  allergic reactions like skin rash, itching or hives, swelling of the face, lips, or tongue  seizures  signs and symptoms of a dangerous change in  heartbeat or heart rhythm like chest pain; dizziness; fast, irregular heartbeat; palpitations; feeling faint or lightheaded; falls; breathing problems  signs and symptoms of methemoglobinemia such as pale, gray, or blue colored skin; headache; fast heartbeat; shortness of breath; feeling faint or lightheaded, falls; tiredness Side effects that usually do not require medical attention (report to your doctor or health care professional if they continue or are bothersome):  anxious  back pain  changes in taste  changes in vision  constipation  dizziness  fever  nausea, vomiting This list may not describe all possible side effects. Call your doctor for medical advice about side effects. You may report side effects to FDA at 1-800-FDA-1088. Where should I keep my medicine? This drug is given in a hospital or clinic and will not be stored at home. NOTE: This sheet is a summary. It may not cover all possible information. If you have questions about this medicine, talk to your doctor, pharmacist, or health care provider.  2021 Elsevier/Gold Standard (2020-01-27 12:24:57)      General Anesthesia, Adult, Care After This sheet gives you information about how to care for yourself after your procedure. Your health care provider may also give you more specific instructions. If you have problems or  questions, contact your health care provider. What can I expect after the procedure? After the procedure, the following side effects are common:  Pain or discomfort at the IV site.  Nausea.  Vomiting.  Sore throat.  Trouble concentrating.  Feeling cold or chills.  Feeling weak or tired.  Sleepiness and fatigue.  Soreness and body aches. These side effects can affect parts of the body that were not involved in surgery. Follow these instructions at home: For the time period you were told by your health care provider:  Rest.  Do not participate in activities where you could fall or  become injured.  Do not drive or use machinery.  Do not drink alcohol.  Do not take sleeping pills or medicines that cause drowsiness.  Do not make important decisions or sign legal documents.  Do not take care of children on your own.   Eating and drinking  Follow any instructions from your health care provider about eating or drinking restrictions.  When you feel hungry, start by eating small amounts of foods that are soft and easy to digest (bland), such as toast. Gradually return to your regular diet.  Drink enough fluid to keep your urine pale yellow.  If you vomit, rehydrate by drinking water, juice, or clear broth. General instructions  If you have sleep apnea, surgery and certain medicines can increase your risk for breathing problems. Follow instructions from your health care provider about wearing your sleep device: ? Anytime you are sleeping, including during daytime naps. ? While taking prescription pain medicines, sleeping medicines, or medicines that make you drowsy.  Have a responsible adult stay with you for the time you are told. It is important to have someone help care for you until you are awake and alert.  Return to your normal activities as told by your health care provider. Ask your health care provider what activities are safe for you.  Take over-the-counter and prescription medicines only as told by your health care provider.  If you smoke, do not smoke without supervision.  Keep all follow-up visits as told by your health care provider. This is important. Contact a health care provider if:  You have nausea or vomiting that does not get better with medicine.  You cannot eat or drink without vomiting.  You have pain that does not get better with medicine.  You are unable to pass urine.  You develop a skin rash.  You have a fever.  You have redness around your IV site that gets worse. Get help right away if:  You have difficulty  breathing.  You have chest pain.  You have blood in your urine or stool, or you vomit blood. Summary  After the procedure, it is common to have a sore throat or nausea. It is also common to feel tired.  Have a responsible adult stay with you for the time you are told. It is important to have someone help care for you until you are awake and alert.  When you feel hungry, start by eating small amounts of foods that are soft and easy to digest (bland), such as toast. Gradually return to your regular diet.  Drink enough fluid to keep your urine pale yellow.  Return to your normal activities as told by your health care provider. Ask your health care provider what activities are safe for you. This information is not intended to replace advice given to you by your health care provider. Make sure you discuss any questions you  have with your health care provider. Document Revised: 07/06/2020 Document Reviewed: 02/03/2020 Elsevier Patient Education  2021 [employer].

## 2021-02-05 NOTE — Anesthesia Postprocedure Evaluation (Signed)
Anesthesia Post Note  Patient: Luis Jarvis  Procedure(s) Performed: CYST EXCISION PILONIDAL SIMPLE (N/A )  Anesthesia Type: General Level of consciousness: awake and alert, oriented and patient cooperative Pain management: pain level controlled Vital Signs Assessment: post-procedure vital signs reviewed and stable Respiratory status: spontaneous breathing and respiratory function stable Cardiovascular status: blood pressure returned to baseline and stable Postop Assessment: no apparent nausea or vomiting Anesthetic complications: no   No complications documented.   Last Vitals:  Vitals:   02/05/21 0930 02/05/21 0941  BP: 123/79 117/81  Pulse: 75 77  Resp: 13 18  Temp:  36.7 C  SpO2: 96% 99%    Last Pain:  Vitals:   02/05/21 0941  TempSrc: Oral  PainSc: 0-No pain                 Lucillie Kiesel, Cristy Friedlander

## 2021-02-05 NOTE — Transfer of Care (Signed)
Immediate Anesthesia Transfer of Care Note  Patient: Luis Jarvis  Procedure(s) Performed: CYST EXCISION PILONIDAL SIMPLE (N/A )  Patient Location: PACU  Anesthesia Type:General  Level of Consciousness: awake, alert , oriented and patient cooperative  Airway & Oxygen Therapy: Patient Spontanous Breathing and Patient connected to face mask oxygen  Post-op Assessment: Report given to RN, Post -op Vital signs reviewed and stable and Patient moving all extremities  Post vital signs: Reviewed and stable  Last Vitals:  Vitals Value Taken Time  BP 133/66 02/05/21 0845  Temp    Pulse 68 02/05/21 0852  Resp 19 02/05/21 0852  SpO2 95 % 02/05/21 0852  Vitals shown include unvalidated device data.  Last Pain:  Vitals:   02/05/21 0629  TempSrc: Oral  PainSc: 0-No pain      Patients Stated Pain Goal: 5 (02/05/21 0629)  Complications: No complications documented.

## 2021-02-05 NOTE — Progress Notes (Signed)
[facility]  Updated mother regarding surgery. Peroxide daily in to the openings. Will see next week.   Algis Greenhouse, MD [facility] [address] [place], [place] [address] [phone] (office)

## 2021-02-05 NOTE — Interval H&P Note (Signed)
History and Physical Interval Note:  02/05/2021 7:30 AM  Luis Jarvis  has presented today for surgery, with the diagnosis of Pilonidal cyst with abscess.  The various methods of treatment have been discussed with the patient and family. After consideration of risks, benefits and other options for treatment, the patient has consented to  Procedure(s): CYST EXCISION PILONIDAL SIMPLE (N/A) as a surgical intervention.  The patient's history has been reviewed, patient examined, no change in status, stable for surgery.  I have reviewed the patient's chart and labs.  Questions were answered to the patient's satisfaction.     Lucretia Roers

## 2021-02-05 NOTE — Op Note (Signed)
[facility] Operative Note  02/05/21  Preoperative Diagnosis: Pilonidal Cyst, recent infection     Postoperative Diagnosis: Same   Procedure(s) Performed:  Minimally Invasive Excision of Pilonidal Cyst and Sinus Track   Surgeon: Leatrice Jewels. Henreitta Leber, MD   Assistants: No qualified resident was available    Anesthesia: General endotracheal   Anesthesiologist: Windell Norfolk, MD    Specimens: None    Estimated Blood Loss: Minimal   Blood Replacement: None    Complications: None   Wound Class: Contaminated    Operative Indications: Luis Jarvis is a 30 yo with a recurrently infected pilonidal cyst that wants to proceed with excision. We discussed excision and risk of bleeding, infection, recurrence, and need to care for the wound.    Findings: Simple pilonidal cyst with sinus track    Procedure: The patient was taken to the operating room and placed supine. General endotrachea anesthesia was induced. Intravenous antibiotics were administered per protocol.  The patient was then positioned in the right lateral decubitus position and all pressure points were padded. A JACHO approved timeout was performed. The gluteal cleft and buttocks were prepared and draped in the usual sterile fashion. The buttock were taped open to allow for adequate visualization.   On investigation, 1 pit was noted in the gluteal cleft in addition to the left sided bulging area from his previous infection and incision and drainage.  I injected methylene blue in the area where the prior incision and drainage had been done to ensure I removed all the tracks.  Using a 7 mm punch biopsy the bulging area was cored out and the sinus track and all hair was removed with trephine and curette. I also used a 5 mm punch biopsy to excise the pit lower in the gluteal cleft.  A total of 1 pit and 1 are of prior infection and drainage were excised with the punch biopsy.   The sinus track was clear of all cyst  wall and hair. The wounds were irrigated with peroxide. All excess hair was removed from around the punch sites. Exparel was injected into the field.  Hemostasis was confirmed. The punch sites were left open to drain.  An ABD and mesh underwear were placed after the patient was turned on their back.   All counts were correct at the end of the case. The patient was awakened from anesthesia and extubated without complication.  The patient went to the PACU in stable condition.   Algis Greenhouse, MD [facility] [address] [place], [place] [address] [phone] (office)

## 2021-02-05 NOTE — Anesthesia Preprocedure Evaluation (Signed)
Anesthesia Evaluation  Patient identified by MRN, date of birth, ID band Patient awake    Reviewed: Allergy & Precautions, H&P , NPO status , Patient's Chart, lab work & pertinent test results, reviewed documented beta blocker date and time   Airway Mallampati: II  TM Distance: >3 FB Neck ROM: full    Dental no notable dental hx.    Pulmonary neg pulmonary ROS, Current Smoker,    Pulmonary exam normal breath sounds clear to auscultation       Cardiovascular Exercise Tolerance: Good negative cardio ROS   Rhythm:regular Rate:Normal     Neuro/Psych negative neurological ROS  negative psych ROS   GI/Hepatic negative GI ROS, Neg liver ROS,   Endo/Other  negative endocrine ROS  Renal/GU negative Renal ROS  negative genitourinary   Musculoskeletal   Abdominal   Peds  Hematology negative hematology ROS (+)   Anesthesia Other Findings   Reproductive/Obstetrics negative OB ROS                             Anesthesia Physical Anesthesia Plan  ASA: II  Anesthesia Plan: General   Post-op Pain Management:    Induction:   PONV Risk Score and Plan: Ondansetron  Airway Management Planned: LMA  Additional Equipment:   Intra-op Plan:   Post-operative Plan:   Informed Consent: I have reviewed the patients History and Physical, chart, labs and discussed the procedure including the risks, benefits and alternatives for the proposed anesthesia with the patient or authorized representative who has indicated his/her understanding and acceptance.     Dental Advisory Given  Plan Discussed with: CRNA  Anesthesia Plan Comments:         Anesthesia Quick Evaluation

## 2021-02-06 ENCOUNTER — Encounter (HOSPITAL_COMMUNITY): Payer: Self-pay | Admitting: General Surgery

## 2021-02-13 ENCOUNTER — Ambulatory Visit (INDEPENDENT_AMBULATORY_CARE_PROVIDER_SITE_OTHER): Payer: 59 | Admitting: General Surgery

## 2021-02-13 ENCOUNTER — Other Ambulatory Visit: Payer: Self-pay

## 2021-02-13 ENCOUNTER — Other Ambulatory Visit: Payer: Self-pay | Admitting: Family Medicine

## 2021-02-13 VITALS — BP 130/86 | HR 92 | Temp 97.6°F | Resp 16 | Ht 72.0 in | Wt 253.0 lb

## 2021-02-13 DIAGNOSIS — L0591 Pilonidal cyst without abscess: Secondary | ICD-10-CM

## 2021-02-13 NOTE — Patient Instructions (Signed)
Continue flushing area twice daily with peroxide. Will see you in 2 weeks. Remain out of work until seen in 2 weeks.

## 2021-02-13 NOTE — Progress Notes (Signed)
[facility] Note   HPI:  30 y.o. Male presents to clinic for post-op follow-up evaluation of his pilonidal excision. He is doing well and flushing BID with peroxide.   Review of Systems:  Improving pain Minor drainage  All other review of systems: otherwise negative   Vital Signs:  BP 130/86   Pulse 92   Temp 97.6 F (36.4 C) (Other (Comment))   Resp 16   Ht 6' (1.829 m)   Wt 253 lb (114.8 kg)   SpO2 94%   BMI 34.31 kg/m    Physical Exam:  Physical Exam Vitals reviewed.  Cardiovascular:     Rate and Rhythm: Normal rate.  Pulmonary:     Effort: Pulmonary effort is normal.  Genitourinary:    Comments: Pilonidal excision sites open, no signs of erythema or infection Neurological:     Mental Status: He is alert.       Assessment:  30 y.o. yo Male with healing pilonidal excision.  Plan:  Continue flushing area twice daily with peroxide. Will see you in 2 weeks. Remain out of work until seen in 2 weeks.   Future Appointments  Date Time Provider Department Center  02/27/2021  9:45 AM Lucretia Roers, MD [facility] None      Algis Greenhouse, MD [facility] [address] [place], [place] [address] [phone] (office)

## 2021-02-27 ENCOUNTER — Encounter: Payer: Self-pay | Admitting: General Surgery

## 2021-02-27 ENCOUNTER — Other Ambulatory Visit: Payer: Self-pay

## 2021-02-27 ENCOUNTER — Ambulatory Visit (INDEPENDENT_AMBULATORY_CARE_PROVIDER_SITE_OTHER): Payer: 59 | Admitting: General Surgery

## 2021-02-27 ENCOUNTER — Other Ambulatory Visit: Payer: Self-pay | Admitting: Family Medicine

## 2021-02-27 VITALS — BP 126/79 | HR 80 | Temp 98.3°F | Resp 14 | Ht 72.0 in | Wt 249.0 lb

## 2021-02-27 DIAGNOSIS — L0591 Pilonidal cyst without abscess: Secondary | ICD-10-CM

## 2021-02-27 NOTE — Patient Instructions (Signed)
Continue with peroxide

## 2021-02-27 NOTE — Progress Notes (Signed)
[facility] Note   HPI:  30 y.o. Male presents to clinic for post-op follow-up evaluation of his pilonidal excision. Patient reports doing well and having no issues. He is still flushing with peroxide.  Review of Systems:  No fever or chills Less drainage  All other review of systems: otherwise negative   Vital Signs:  BP 126/79   Pulse 80   Temp 98.3 F (36.8 C) (Other (Comment))   Resp 14   Ht 6' (1.829 m)   Wt 249 lb (112.9 kg)   SpO2 96%   BMI 33.77 kg/m    Physical Exam:  Physical Exam Cardiovascular:     Rate and Rhythm: Normal rate.  Pulmonary:     Effort: Pulmonary effort is normal.  Genitourinary:    Comments: Pilonidal excision sites probed, opened and more superficial     Assessment:  30 y.o. yo Male s./p pilonidal excision. Doing well.  Plan:  - Continue with peroxide   Future Appointments  Date Time Provider Department Center  03/06/2021 10:00 AM Lucretia Roers, MD [facility] None      Algis Greenhouse, MD [facility] [address] [place], [place] [address] [phone] (office)

## 2021-03-06 ENCOUNTER — Encounter: Payer: Self-pay | Admitting: General Surgery

## 2021-03-06 ENCOUNTER — Other Ambulatory Visit: Payer: Self-pay | Admitting: Family Medicine

## 2021-03-06 ENCOUNTER — Ambulatory Visit (INDEPENDENT_AMBULATORY_CARE_PROVIDER_SITE_OTHER): Payer: 59 | Admitting: General Surgery

## 2021-03-06 ENCOUNTER — Other Ambulatory Visit: Payer: Self-pay

## 2021-03-06 VITALS — BP 132/85 | HR 88 | Temp 98.7°F | Resp 14 | Ht 72.0 in | Wt 247.0 lb

## 2021-03-06 DIAGNOSIS — L0591 Pilonidal cyst without abscess: Secondary | ICD-10-CM

## 2021-03-06 NOTE — Patient Instructions (Signed)
Peroxide to area for next 1-2 days. Should seal up completely after that. Return to work 03/12/2021 Monday. No restrictions.

## 2021-03-08 NOTE — Progress Notes (Signed)
[facility] Note   HPI:  30 y.o. Male presents to clinic for post-op follow-up evaluation of his pilonidal excision. Patient reports doing well and healing.   Review of Systems:  No drainage No redness  All other review of systems: otherwise negative   Vital Signs:  BP 132/85   Pulse 88   Temp 98.7 F (37.1 C) (Other (Comment))   Resp 14   Ht 6' (1.829 m)   Wt 247 lb (112 kg)   SpO2 94%   BMI 33.50 kg/m    Physical Exam:  Physical Exam Vitals reviewed.  Cardiovascular:     Rate and Rhythm: Normal rate.  Pulmonary:     Effort: Pulmonary effort is normal.  Genitourinary:    Comments: Gluteal cleft healing, inferior site slightly open, probed and silver nitrate applied, <24mm in size not epithelized, superior site closed     Assessment:  30 y.o. yo Male with healing pilonidal excision. Doing well.  Plan:  Peroxide to area for next 1-2 days. Should seal up completely after that. Return to work 03/12/2021 Monday. No restrictions.   PRN return  Algis Greenhouse, MD [facility] [address] [place], [place] [address] [phone] (office)

## 2021-08-25 ENCOUNTER — Telehealth: Payer: 59 | Admitting: Nurse Practitioner

## 2021-08-25 DIAGNOSIS — J00 Acute nasopharyngitis [common cold]: Secondary | ICD-10-CM | POA: Diagnosis not present

## 2021-08-25 MED ORDER — AMOXICILLIN-POT CLAVULANATE 875-125 MG PO TABS: 1.0000 | ORAL_TABLET | Freq: Two times a day (BID) | ORAL | 0 refills | Status: DC

## 2021-08-25 NOTE — Progress Notes (Signed)
Virtual Visit Consent   Luis Jarvis, you are scheduled for a virtual visit with Mary-Margaret Daphine Deutscher, FNP, a [facility] provider, today.     Just as with appointments in the office, your consent must be obtained to participate.  Your consent will be active for this visit and any virtual visit you may have with one of our providers in the next 365 days.     If you have a MyChart account, a copy of this consent can be sent to you electronically.  All virtual visits are billed to your insurance company just like a traditional visit in the office.    As this is a virtual visit, video technology does not allow for your provider to perform a traditional examination.  This may limit your provider's ability to fully assess your condition.  If your provider identifies any concerns that need to be evaluated in person or the need to arrange testing (such as labs, EKG, etc.), we will make arrangements to do so.     Although advances in technology are sophisticated, we cannot ensure that it will always work on either your end or our end.  If the connection with a video visit is poor, the visit may have to be switched to a telephone visit.  With either a video or telephone visit, we are not always able to ensure that we have a secure connection.     I need to obtain your verbal consent now.   Are you willing to proceed with your visit today? YES   Luis Jarvis has provided verbal consent on 08/25/2021 for a virtual visit (video or telephone).   Mary-Margaret Daphine Deutscher, FNP   Date: 08/25/2021 3:28 PM   Virtual Visit via Video Note   I, Mary-Margaret Astor Gentle, connected with Luis Jarvis ([id number], 1990/11/16) on 08/25/21 at  3:30 PM EDT by a video-enabled telemedicine application and verified that I am speaking with the correct person using two identifiers.  Location: Patient: Virtual Visit Location Patient: Home Provider: Virtual Visit Location Provider: Mobile   I discussed the  limitations of evaluation and management by telemedicine and the availability of in person appointments. The patient expressed understanding and agreed to proceed.    History of Present Illness: Luis Jarvis is a 30 y.o. who identifies as a male who was assigned male at birth, and is being seen today for congestion.  HPI: Patient is c/o cough and congestion for 8 days. Says he has been taking dayquil and nightquil OTC.   Review of Systems  Constitutional:  Positive for malaise/fatigue. Negative for chills and fever.  HENT:  Positive for congestion and sore throat (slight).   Respiratory:  Positive for cough. Negative for sputum production and shortness of breath.   Musculoskeletal:  Negative for myalgias.  Neurological:  Negative for headaches.   Problems:  Patient Active Problem List   Diagnosis Date Noted   Infected pilonidal cyst 10/24/2020   Anal warts 12/01/2015   Fatigue 12/01/2015   Closed low lateral malleolus fracture 07/21/2013    Allergies: No Known Allergies Medications:  Current Outpatient Medications:    cetirizine (ZYRTEC) 10 MG tablet, Take 10 mg by mouth daily as needed for allergies., Disp: , Rfl:    ondansetron (ZOFRAN) 4 MG tablet, Take 1 tablet (4 mg total) by mouth every 8 (eight) hours as needed., Disp: 30 tablet, Rfl: 1   oxyCODONE (ROXICODONE) 5 MG immediate release tablet, Take 1 tablet (5 mg total) by mouth every  4 (four) hours as needed for severe pain or breakthrough pain., Disp: 15 tablet, Rfl: 0  Observations/Objective: Patient is well-developed, well-nourished in no acute distress.  Resting comfortably  at home.  Head is normocephalic, atraumatic.  No labored breathing.  Speech is clear and coherent with logical content.  Patient is alert and oriented at baseline.  Voice nasal sounding No cough during visit  Assessment and Plan:  Luis Jarvis in today with chief complaint of No chief complaint on file.   1. Acute nasopharyngitis 1.  Take meds as prescribed 2. Use a cool mist humidifier especially during the winter months and when heat has been humid. 3. Use saline nose sprays frequently 4. Saline irrigations of the nose can be very helpful if done frequently.  * 4X daily for 1 week*  * Use of a nettie pot can be helpful with this. Follow directions with this* 5. Drink plenty of fluids 6. Keep thermostat turn down low 7.For any cough or congestion  Use plain Mucinex- regular strength or max strength is fine   * Children- consult with Pharmacist for dosing 8. For fever or aces or pains- take tylenol or ibuprofen appropriate for age and weight.  * for fevers greater than 101 orally you may alternate ibuprofen and tylenol every  3 hours.   Meds ordered this encounter  Medications   amoxicillin-clavulanate (AUGMENTIN) 875-125 MG tablet    Sig: Take 1 tablet by mouth 2 (two) times daily.    Dispense:  14 tablet    Refill:  0    Order Specific Question:   Supervising Provider    Answer:   Eber Hong [[id number]]      Follow Up Instructions: I discussed the assessment and treatment plan with the patient. The patient was provided an opportunity to ask questions and all were answered. The patient agreed with the plan and demonstrated an understanding of the instructions.  A copy of instructions were sent to the patient via MyChart.  The patient was advised to call back or seek an in-person evaluation if the symptoms worsen or if the condition fails to improve as anticipated.  Time:  I spent 10 minutes with the patient via telehealth technology discussing the above problems/concerns.    Mary-Margaret Daphine Deutscher, FNP

## 2022-02-11 ENCOUNTER — Ambulatory Visit (INDEPENDENT_AMBULATORY_CARE_PROVIDER_SITE_OTHER): Payer: 59 | Admitting: Family Medicine

## 2022-02-11 VITALS — BP 136/86 | Temp 98.0°F | Ht 72.0 in | Wt 265.8 lb

## 2022-02-11 DIAGNOSIS — M6208 Separation of muscle (nontraumatic), other site: Secondary | ICD-10-CM | POA: Diagnosis not present

## 2022-02-11 DIAGNOSIS — G4733 Obstructive sleep apnea (adult) (pediatric): Secondary | ICD-10-CM | POA: Diagnosis not present

## 2022-02-11 DIAGNOSIS — E669 Obesity, unspecified: Secondary | ICD-10-CM

## 2022-02-11 DIAGNOSIS — R5382 Chronic fatigue, unspecified: Secondary | ICD-10-CM | POA: Diagnosis not present

## 2022-02-11 DIAGNOSIS — Z6836 Body mass index (BMI) 36.0-36.9, adult: Secondary | ICD-10-CM

## 2022-02-11 NOTE — Assessment & Plan Note (Signed)
Chronic fatigue.  Labs today.  Endorses snoring and states that his significant other has noticed apneic episodes.  Arranging for sleep study. ?

## 2022-02-11 NOTE — Assessment & Plan Note (Signed)
Diastases versus ventral hernia.  Discussed need for weight loss.  Also discussed imaging and patient elected to wait at this time. ?

## 2022-02-11 NOTE — Patient Instructions (Signed)
Keep  a close eye on the area. ? ?Labs ordered as well as referral for sleep study. ? ?Take care ? ?Dr. Adriana Simas  ?

## 2022-02-11 NOTE — Progress Notes (Signed)
? ?Subjective:  ?Patient ID: Luis Jarvis, male    DOB: 1991-09-03  Age: 31 y.o. MRN: [id number] ? ?CC: ?Chief Complaint  ?Patient presents with  ? possible hernia in abdomen  ?  For 3 weeks  ? ? ?HPI: ? ?31 year old male presents for evaluation of the above.  Patient also complains of fatigue. ? ?Patient reports that for quite some time now he has noticed a possible hernia at his umbilicus.  He states he has had some discomfort.  No known inciting factor or injury.  No current abdominal pain.  No other associated symptoms. ? ?Patient also reports ongoing fatigue.  Patient states that he can sleep more than 15 hours and still wake up tired.  He states that he snores and his significant other confirms this.  She has witnessed apneic episodes during sleep. ? ?Patient Active Problem List  ? Diagnosis Date Noted  ? Diastasis recti 02/11/2022  ? Anal warts 12/01/2015  ? Fatigue 12/01/2015  ? ? ?Social Hx   ?Social History  ? ?Socioeconomic History  ? Marital status: Married  ?  Spouse name: Not on file  ? Number of children: Not on file  ? Years of education: Not on file  ? Highest education level: Not on file  ?Occupational History  ? Not on file  ?Tobacco Use  ? Smoking status: Some Days  ?  Types: Cigarettes  ? Smokeless tobacco: Never  ?Substance and Sexual Activity  ? Alcohol use: Yes  ? Drug use: No  ? Sexual activity: Not on file  ?Other Topics Concern  ? Not on file  ?Social History Narrative  ? Not on file  ? ?Social Determinants of Health  ? ?Financial Resource Strain: Not on file  ?Food Insecurity: Not on file  ?Transportation Needs: Not on file  ?Physical Activity: Not on file  ?Stress: Not on file  ?Social Connections: Not on file  ? ? ?Review of Systems ?Per HPI ? ?Objective:  ?BP 136/86   Temp 98 ?F (36.7 ?C) (Oral)   Ht 6' (1.829 m)   Wt 265 lb 12.8 oz (120.6 kg)   BMI 36.05 kg/m?  ? ? ?  02/11/2022  ? 11:29 AM 03/06/2021  ?  9:51 AM 02/27/2021  ?  9:17 AM  ?BP/Weight  ?Systolic BP 527 782 423   ?Diastolic BP 86 85 79  ?Wt. (Lbs) 265.8 247 249  ?BMI 36.05 kg/m2 33.5 kg/m2 33.77 kg/m2  ? ? ?Physical Exam ?Constitutional:   ?   General: He is not in acute distress. ?   Appearance: He is obese.  ?HENT:  ?   Head: Normocephalic and atraumatic.  ?Cardiovascular:  ?   Rate and Rhythm: Normal rate and regular rhythm.  ?   Heart sounds: No murmur heard. ?Pulmonary:  ?   Effort: Pulmonary effort is normal.  ?   Breath sounds: Normal breath sounds. No wheezing or rales.  ?Abdominal:  ?   Palpations: Abdomen is soft.  ?   Comments: Diastases recti versus ventral hernia.  Favor diastases.  ?Neurological:  ?   Mental Status: He is alert.  ? ? ?Lab Results  ?Component Value Date  ? WBC 5.6 07/15/2017  ? HGB 15.2 07/15/2017  ? HCT 42.8 07/15/2017  ? PLT 191 07/15/2017  ? GLUCOSE 80 08/28/2018  ? CHOL 143 08/28/2018  ? TRIG 170 (H) 08/28/2018  ? HDL 46 08/28/2018  ? Douglas 63 08/28/2018  ? ALT 38 08/28/2018  ? AST 21 08/28/2018  ?  NA 141 08/28/2018  ? K 4.7 08/28/2018  ? CL 100 08/28/2018  ? CREATININE 0.96 08/28/2018  ? BUN 12 08/28/2018  ? CO2 23 08/28/2018  ? TSH 1.110 12/01/2015  ? INR 1.3 12/25/2007  ? ? ? ?Assessment & Plan:  ? ?Problem List Items Addressed This Visit   ? ?  ? Musculoskeletal and Integument  ? Diastasis recti - Primary  ?  Diastases versus ventral hernia.  Discussed need for weight loss.  Also discussed imaging and patient elected to wait at this time. ?  ?  ?  ? Other  ? Fatigue  ?  Chronic fatigue.  Labs today.  Endorses snoring and states that his significant other has noticed apneic episodes.  Arranging for sleep study. ?  ?  ? Relevant Orders  ? CBC  ? TSH  ? Vitamin D (25 hydroxy)  ? Ambulatory referral to Sleep Studies  ? ?Other Visit Diagnoses   ? ? Class 2 obesity without serious comorbidity with body mass index (BMI) of 36.0 to 36.9 in adult, unspecified obesity type      ? Relevant Orders  ? CMP14+EGFR  ? Hemoglobin A1c  ? Lipid panel  ? Ambulatory referral to Sleep Studies  ? OSA  (obstructive sleep apnea)      ? ?  ? ? ?Thersa Salt DO ?[facility] ? ?

## 2022-02-12 LAB — HEMOGLOBIN A1C
Est. average glucose Bld gHb Est-mCnc: 111 mg/dL
Hgb A1c MFr Bld: 5.5 % (ref 4.8–5.6)

## 2022-02-12 LAB — CMP14+EGFR
ALT: 75 IU/L — ABNORMAL HIGH (ref 0–44)
AST: 41 IU/L — ABNORMAL HIGH (ref 0–40)
Albumin/Globulin Ratio: 2.2 (ref 1.2–2.2)
Albumin: 5.1 g/dL (ref 4.1–5.2)
Alkaline Phosphatase: 66 IU/L (ref 44–121)
BUN/Creatinine Ratio: 13 (ref 9–20)
BUN: 12 mg/dL (ref 6–20)
Bilirubin Total: 0.7 mg/dL (ref 0.0–1.2)
CO2: 21 mmol/L (ref 20–29)
Calcium: 10 mg/dL (ref 8.7–10.2)
Chloride: 102 mmol/L (ref 96–106)
Creatinine, Ser: 0.92 mg/dL (ref 0.76–1.27)
Globulin, Total: 2.3 g/dL (ref 1.5–4.5)
Glucose: 82 mg/dL (ref 70–99)
Potassium: 4.8 mmol/L (ref 3.5–5.2)
Sodium: 141 mmol/L (ref 134–144)
Total Protein: 7.4 g/dL (ref 6.0–8.5)
eGFR: 115 mL/min/{1.73_m2} (ref >59)

## 2022-02-12 LAB — CBC
Hematocrit: 50.1 % (ref 37.5–51.0)
Hemoglobin: 17.5 g/dL (ref 13.0–17.7)
MCH: 29.7 pg (ref 26.6–33.0)
MCHC: 34.9 g/dL (ref 31.5–35.7)
MCV: 85 fL (ref 79–97)
Platelets: 220 10*3/uL (ref 150–450)
RBC: 5.9 x10E6/uL — ABNORMAL HIGH (ref 4.14–5.80)
RDW: 12.1 % (ref 11.6–15.4)
WBC: 7.1 10*3/uL (ref 3.4–10.8)

## 2022-02-12 LAB — VITAMIN D 25 HYDROXY (VIT D DEFICIENCY, FRACTURES): Vit D, 25-Hydroxy: 26.4 ng/mL — ABNORMAL LOW (ref 30.0–100.0)

## 2022-02-12 LAB — TSH: TSH: 1.61 u[IU]/mL (ref 0.450–4.500)

## 2022-02-12 LAB — LIPID PANEL
Chol/HDL Ratio: 3.6 ratio (ref 0.0–5.0)
Cholesterol, Total: 181 mg/dL (ref 100–199)
HDL: 50 mg/dL (ref >39)
LDL Chol Calc (NIH): 102 mg/dL — ABNORMAL HIGH (ref 0–99)
Triglycerides: 167 mg/dL — ABNORMAL HIGH (ref 0–149)
VLDL Cholesterol Cal: 29 mg/dL (ref 5–40)

## 2022-02-27 ENCOUNTER — Ambulatory Visit (INDEPENDENT_AMBULATORY_CARE_PROVIDER_SITE_OTHER): Payer: 59 | Admitting: Neurology

## 2022-02-27 ENCOUNTER — Encounter: Payer: Self-pay | Admitting: Neurology

## 2022-02-27 VITALS — BP 129/76 | HR 67 | Ht 72.0 in | Wt 263.6 lb

## 2022-02-27 DIAGNOSIS — R0681 Apnea, not elsewhere classified: Secondary | ICD-10-CM

## 2022-02-27 DIAGNOSIS — R519 Headache, unspecified: Secondary | ICD-10-CM | POA: Diagnosis not present

## 2022-02-27 DIAGNOSIS — G4719 Other hypersomnia: Secondary | ICD-10-CM | POA: Diagnosis not present

## 2022-02-27 DIAGNOSIS — E669 Obesity, unspecified: Secondary | ICD-10-CM

## 2022-02-27 DIAGNOSIS — R0683 Snoring: Secondary | ICD-10-CM
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
## 2022-02-27 NOTE — Patient Instructions (Signed)

## 2022-03-06 ENCOUNTER — Telehealth: Payer: Self-pay

## 2022-03-06 NOTE — Telephone Encounter (Signed)
LVM for pt to call me back to schedule sleep study  

## 2022-03-18 ENCOUNTER — Ambulatory Visit (INDEPENDENT_AMBULATORY_CARE_PROVIDER_SITE_OTHER): Payer: 59 | Admitting: Neurology

## 2022-03-18 DIAGNOSIS — G4733 Obstructive sleep apnea (adult) (pediatric): Secondary | ICD-10-CM | POA: Diagnosis not present

## 2022-03-18 DIAGNOSIS — E669 Obesity, unspecified: Secondary | ICD-10-CM

## 2022-03-18 DIAGNOSIS — R519 Headache, unspecified: Secondary | ICD-10-CM

## 2022-03-18 DIAGNOSIS — G4734 Idiopathic sleep related nonobstructive alveolar hypoventilation: Secondary | ICD-10-CM

## 2022-03-18 DIAGNOSIS — R0681 Apnea, not elsewhere classified: Secondary | ICD-10-CM

## 2022-03-18 DIAGNOSIS — R0683 Snoring: Secondary | ICD-10-CM

## 2022-03-18 DIAGNOSIS — G4719 Other hypersomnia: Secondary | ICD-10-CM

## 2022-03-20 ENCOUNTER — Telehealth: Payer: Self-pay | Admitting: *Deleted

## 2022-03-20 NOTE — Progress Notes (Signed)
See procedure note.

## 2022-03-20 NOTE — Telephone Encounter (Signed)
-----   Message from Star Age, MD sent at 03/20/2022 11:47 AM EDT ----- ?Patient referred by PCP, seen by me on 02/27/22, patient had a HST on 03/18/22.   ? ?Urgent set up requested due to severe sleep apnea. ? ?Please call and notify the patient that the recent home sleep test showed obstructive sleep apnea in the severe range. I recommend treatment for this in the form of autoPAP, which means, that we don't have to bring him in for a sleep study with CPAP, but will let him start using a so called autoPAP machine at home, through a DME company (of his choice, or as per insurance requirement). The DME representative will fit the patient with a mask of choice, educate him on how to use the machine, how to put the mask on, etc. I have placed an order in the chart. Please send the order to a local DME, talk to patient, send report to referring MD. Please also reinforce the need for compliance with treatment. We will need a FU in sleep clinic for 10 weeks post-PAP set up, please arrange that with me or one of our NPs. Thanks,  ? ?Star Age, MD, PhD ?[name]) ? ? ? ? ?

## 2022-03-20 NOTE — Procedures (Signed)
? ?  [name] ? ?HOME SLEEP TEST (Watch PAT) REPORT ? ?STUDY DATE: 03/18/2022 ? ?DOB: December 18, 1990 ? ?MRN: [id number] ? ?ORDERING CLINICIAN: Huston Foley, MD, PhD ?  ?REFERRING CLINICIAN: Babs Sciara, MD  ? ?CLINICAL INFORMATION/HISTORY: 31 year old right-handed gentleman with an underlying medical history of abdominal hernia, tobacco use, and obesity, who reports snoring and excessive daytime somnolence as well as witnessed apneas. ? ?Epworth sleepiness score: 18/24. ? ?BMI: 35.5 kg/m? ? ?FINDINGS:  ? ?Sleep Summary:  ? ?Total Recording Time (hours, min): 7 hours, 6 minutes ? ?Total Sleep Time (hours, min):  6 hours, 4 minutes  ? ?Percent REM (%):    23.2%  ? ?Respiratory Indices:  ? ?Calculated pAHI (per hour):  41.6/hour        ? ?REM pAHI:    24.3/hour      ? ?NREM pAHI: 46.8/hour ? ?Oxygen Saturation Statistics:  ?  ?Oxygen Saturation (%) Mean: 94%  ? ?Minimum oxygen saturation (%):                 75%  ? ?O2 Saturation Range (%): 75-100%   ? ?O2 Saturation (minutes) <=88%: 25.9 min ? ?Pulse Rate Statistics:  ? ?Pulse Mean (bpm):    71/min   ? ?Pulse Range (56-101/min)  ? ?IMPRESSION: OSA (obstructive sleep apnea), severe ?Nocturnal hypoxemia ? ?RECOMMENDATION:  ?This home sleep test demonstrates severe obstructive sleep apnea with a total AHI of 41.6/hour and O2 nadir of 75% with significant time below 88% saturation of over 25 minutes for the night, in keeping with nocturnal hypoxemia.  Snoring ranged from mild to loud.  Treatment with positive airway pressure is highly recommended. This will require - ideally - a full night CPAP titration study for proper treatment settings, O2 monitoring and mask fitting. For now, the patient will be advised to proceed with an autoPAP titration/trial at home.  A laboratory attended titration study can be considered in the future for optimization of his treatment and better tolerance of therapy.  Alternative treatment options are limited secondary to the  severity of the patient's sleep disordered breathing.  In selected patients, inspire, a hypoglossal nerve stimulator, can be a treatment option.  Concomitant weight loss is recommended.  Please note, that untreated obstructive sleep apnea may carry additional perioperative morbidity. Patients with significant obstructive sleep apnea should receive perioperative PAP therapy and the surgeons and particularly the anesthesiologist should be informed of the diagnosis and the severity of the sleep disordered breathing. ?The patient should be cautioned not to drive, work at heights, or operate dangerous or heavy equipment when tired or sleepy. Review and reiteration of good sleep hygiene measures should be pursued with any patient. ?Other causes of the patient's symptoms, including circadian rhythm disturbances, an underlying mood disorder, medication effect and/or an underlying medical problem cannot be ruled out based on this test. Clinical correlation is recommended. The patient and his referring provider will be notified of the test results. The patient will be seen in follow up in sleep clinic at [facility]. ? ?I certify that I have reviewed the raw data recording prior to the issuance of this report in accordance with the standards of the American Academy of Sleep Medicine (AASM). ? ?INTERPRETING PHYSICIAN:  ? ?Huston Foley, MD, PhD  ?Board Certified in Neurology and Sleep Medicine ? ?[name] ?[address], [address] ?[place], [place] [address] ?[phone] ? ? ? ? ? ? ? ? ? ? ? ? ? ? ? ? ? ?

## 2022-03-20 NOTE — Telephone Encounter (Signed)
[facility] does take [facility] commercial. I have faxed the orders there [phone]. Received a receipt of confirmation. Also sent pt a mychart message.  ? ?

## 2022-03-20 NOTE — Addendum Note (Signed)
Addended by: Huston Foley on: 03/20/2022 11:47 AM ? ? Modules accepted: Orders ? ?

## 2022-03-20 NOTE — Telephone Encounter (Signed)
Spoke with the patient and discussed his home sleep test.  He understands the sleep test showed obstructive sleep apnea in the severe range.  Patient is amenable to trying AutoPap.  He understands insurance compliance requirements which includes using the machine at least 4 hours at night and also being seen in the office between 30 and 90 days after set up.  He has been scheduled for an initial follow-up appointment on Wednesday, August 9 at 3:45 PM arrival 30 minutes early with  machine and power cord.  Patient's questions were answered.  We discussed possible DME companies.  He would prefer [facility] first, then [facility] family pharmacy, then at [place] location depending on who takes his insurance.  I am currently waiting to hear back from [facility] to see if they take his insurance. ? ?Order, sleep study, office note, insurance information has all been printed and ready to fax. Results sent to referring MD.  ?

## 2022-06-12 ENCOUNTER — Ambulatory Visit: Payer: 59 | Admitting: Adult Health

## 2022-07-04 ENCOUNTER — Ambulatory Visit: Payer: 59 | Admitting: Adult Health

## 2022-07-04 ENCOUNTER — Telehealth: Payer: Self-pay

## 2022-07-04 ENCOUNTER — Encounter: Payer: Self-pay | Admitting: Adult Health

## 2022-07-04 VITALS — BP 139/81 | HR 75 | Ht 72.0 in | Wt 265.0 lb

## 2022-07-04 DIAGNOSIS — Z9989 Dependence on other enabling machines and devices: Secondary | ICD-10-CM | POA: Diagnosis not present

## 2022-07-04 DIAGNOSIS — G4733 Obstructive sleep apnea (adult) (pediatric): Secondary | ICD-10-CM

## 2022-07-04 NOTE — Telephone Encounter (Signed)
Cpap orders have been faxed to DME: [facility].
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
## 2022-07-04 NOTE — Patient Instructions (Signed)
Continue nightly use of CPAP but unsure you are using greater than 4 hours per night for optimal benefit and for insurance purposes.  If you find yourself still removing her mask while sleeping, you can try to use mid day while sitting and watching TV or reading for 15 to 30 minutes which will help with desensitization to CPAP mask.  Continue to follow with your DME company [facility] for any needed supplies or CPAP related concerns   Follow-up in 6 months or call earlier if needed

## 2022-08-31 ENCOUNTER — Ambulatory Visit: Payer: 59

## 2022-12-10 ENCOUNTER — Telehealth: Payer: 59 | Admitting: Physician Assistant

## 2022-12-10 DIAGNOSIS — R6889 Other general symptoms and signs: Secondary | ICD-10-CM | POA: Diagnosis not present

## 2022-12-10 MED ORDER — BENZONATATE 100 MG PO CAPS: 100.0000 mg | ORAL_CAPSULE | Freq: Three times a day (TID) | ORAL | 0 refills | Status: DC | PRN

## 2022-12-10 MED ORDER — FLUTICASONE PROPIONATE 50 MCG/ACT NA SUSP: 2.0000 | Freq: Every day | NASAL | 0 refills | Status: DC

## 2022-12-10 NOTE — Progress Notes (Signed)
E visit for Flu like symptoms   We are sorry that you are not feeling well.  Here is how we plan to help! Based on what you have shared with me it looks like you may have flu-like symptoms that should be watched but do not seem to indicate anti-viral treatment.  It is recommended to take an at home Covid 19 test, if desired.  Influenza or "the flu" is   an infection caused by a respiratory virus. The flu virus is highly contagious and persons who did not receive their yearly flu vaccination may "catch" the flu from close contact.  We have anti-viral medications to treat the viruses that cause this infection. They are not a "cure" and only shorten the course of the infection. These prescriptions are most effective when they are given within the first 2 days of "flu" symptoms. Antiviral medication are indicated if you have a high risk of complications from the flu.   Based upon your symptoms and potential risk factors I recommend that you follow the flu symptoms recommendation that I have listed below.  This is an infection that is most likely caused by a virus. There are no specific treatments other than to help you with the symptoms until the infection runs its course.  We are sorry you are not feeling well.  Here is how we plan to help!  For nasal congestion, you may use an oral decongestants such as Mucinex D or if you have glaucoma or high blood pressure use plain Mucinex.  Saline nasal spray or nasal drops can help and can safely be used as often as needed for congestion.  For your congestion, I have prescribed Fluticasone nasal spray one spray in each nostril twice a day  If you do not have a history of heart disease, hypertension, diabetes or thyroid disease, prostate/bladder issues or glaucoma, you may also use Sudafed to treat nasal congestion.  It is highly recommended that you consult with a pharmacist or your primary care physician to ensure this medication is safe for you to take.      If you have a cough, you may use cough suppressants such as Delsym and Robitussin.  If you have glaucoma or high blood pressure, you can also use Coricidin HBP.   For cough I have prescribed for you A prescription cough medication called Tessalon Perles 100 mg. You may take 1-2 capsules every 8 hours as needed for cough  If you have a sore or scratchy throat, use a saltwater gargle-  to  teaspoon of salt dissolved in a 4-ounce to 8-ounce glass of warm water.  Gargle the solution for approximately 15-30 seconds and then spit.  It is important not to swallow the solution.  You can also use throat lozenges/cough drops and Chloraseptic spray to help with throat pain or discomfort.  Warm or cold liquids can also be helpful in relieving throat pain.  For headache, pain or general discomfort, you can use Ibuprofen or Tylenol as directed.   Some authorities believe that zinc sprays or the use of Echinacea may shorten the course of your symptoms.   ANYONE WHO HAS FLU SYMPTOMS SHOULD: Stay home. The flu is highly contagious and going out or to work exposes others! Be sure to drink plenty of fluids. Water is fine as well as fruit juices, sodas and electrolyte beverages. You may want to stay away from caffeine or alcohol. If you are nauseated, try taking small sips of liquids. How do you  know if you are getting enough fluid? Your urine should be a pale yellow or almost colorless. Get rest. Taking a steamy shower or using a humidifier may help nasal congestion and ease sore throat pain. Using a saline nasal spray works much the same way. Cough drops, hard candies and sore throat lozenges may ease your cough. Line up a caregiver. Have someone check on you regularly.   GET HELP RIGHT AWAY IF: You cannot keep down liquids or your medications. You become short of breath Your fell like you are going to pass out or loose consciousness. Your symptoms persist after you have completed your treatment plan MAKE  SURE YOU  Understand these instructions. Will watch your condition. Will get help right away if you are not doing well or get worse.  Your e-visit answers were reviewed by a board certified advanced clinical practitioner to complete your personal care plan.  Depending on the condition, your plan could have included both over the counter or prescription medications.  If there is a problem please reply  once you have received a response from your provider.  Your safety is important to [place].  If you have drug allergies check your prescription carefully.    You can use MyChart to ask questions about today's visit, request a non-urgent call back, or ask for a work or school excuse for 24 hours related to this e-Visit. If it has been greater than 24 hours you will need to follow up with your provider, or enter a new e-Visit to address those concerns.  You will get an e-mail in the next two days asking about your experience.  I hope that your e-visit has been valuable and will speed your recovery. Thank you for using e-visits.  I have spent 5 minutes in review of e-visit questionnaire, review and updating patient chart, medical decision making and response to patient.   Mar Daring, PA-C

## 2022-12-12 ENCOUNTER — Telehealth: Payer: 59 | Admitting: Physician Assistant

## 2022-12-12 DIAGNOSIS — B9789 Other viral agents as the cause of diseases classified elsewhere: Secondary | ICD-10-CM

## 2022-12-12 MED ORDER — PREDNISONE 20 MG PO TABS: 40.0000 mg | ORAL_TABLET | Freq: Every day | ORAL | 0 refills | Status: DC

## 2022-12-12 NOTE — Progress Notes (Signed)
I have spent 5 minutes in review of e-visit questionnaire, review and updating patient chart, medical decision making and response to patient.   Corleone Biegler Cody Gabor Lusk, PA-C    

## 2022-12-12 NOTE — Progress Notes (Signed)
E-Visit for Sinus Problems  We are sorry that you are not feeling well.  Here is how we plan to help!  Based on what you have shared with me it looks like you have sinusitis.  Sinusitis is inflammation and infection in the sinus cavities of the head.  Based on your presentation I believe you most likely have Acute Viral Sinusitis.This is an infection most likely caused by a virus. There is not specific treatment for viral sinusitis other than to help you with the symptoms until the infection runs its course.  You may use an oral decongestant such as Mucinex D or if you have glaucoma or high blood pressure use plain Mucinex. Saline nasal spray help and can safely be used as often as needed for congestion.  I have prescribed a short course of prednisone to take as directed.  Some authorities believe that zinc sprays or the use of Echinacea may shorten the course of your symptoms.  Sinus infections are not as easily transmitted as other respiratory infection, however we still recommend that you avoid close contact with loved ones, especially the very young and elderly.  Remember to wash your hands thoroughly throughout the day as this is the number one way to prevent the spread of infection!  Home Care: Only take medications as instructed by your medical team. Do not take these medications with alcohol. A steam or ultrasonic humidifier can help congestion.  You can place a towel over your head and breathe in the steam from hot water coming from a faucet. Avoid close contacts especially the very young and the elderly. Cover your mouth when you cough or sneeze. Always remember to wash your hands.  Get Help Right Away If: You develop worsening fever or sinus pain. You develop a severe head ache or visual changes. Your symptoms persist after you have completed your treatment plan.  Make sure you Understand these instructions. Will watch your condition. Will get help right away if you are not  doing well or get worse.   Thank you for choosing an e-visit.  Your e-visit answers were reviewed by a board certified advanced clinical practitioner to complete your personal care plan. Depending upon the condition, your plan could have included both over the counter or prescription medications.  Please review your pharmacy choice. Make sure the pharmacy is open so you can pick up prescription now. If there is a problem, you may contact your provider through [employer] and have the prescription routed to another pharmacy.  Your safety is important to [place]. If you have drug allergies check your prescription carefully.   For the next 24 hours you can use MyChart to ask questions about today's visit, request a non-urgent call back, or ask for a work or school excuse. You will get an email in the next two days asking about your experience. I hope that your e-visit has been valuable and will speed your recovery.

## 2023-01-01 ENCOUNTER — Ambulatory Visit: Payer: 59 | Admitting: Adult Health
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
## 2023-01-06 ENCOUNTER — Encounter: Payer: Self-pay | Admitting: Adult Health

## 2023-01-06 ENCOUNTER — Ambulatory Visit: Payer: 59 | Admitting: Adult Health

## 2023-01-06 VITALS — BP 124/78 | HR 84 | Ht 72.0 in | Wt 269.0 lb

## 2023-01-06 DIAGNOSIS — G4733 Obstructive sleep apnea (adult) (pediatric): Secondary | ICD-10-CM | POA: Diagnosis not present

## 2023-01-27 ENCOUNTER — Ambulatory Visit: Payer: 59 | Admitting: Family Medicine

## 2023-01-27 VITALS — BP 129/84 | HR 90 | Ht 72.0 in | Wt 259.0 lb

## 2023-01-27 DIAGNOSIS — R1011 Right upper quadrant pain: Secondary | ICD-10-CM

## 2023-01-27 DIAGNOSIS — Z3009 Encounter for other general counseling and advice on contraception: Secondary | ICD-10-CM | POA: Diagnosis not present

## 2023-01-27 NOTE — Progress Notes (Signed)
   Subjective:    Patient ID: Luis Jarvis, male    DOB: [birth year], 32 y.o.   MRN: [id number]  HPI Patient following up from urgent care for gallbladder issues . Had referral for GI but canceled to see Dr Nicki Reaper. Patient denies any pain now  Patient with right upper quadrant pain discomfort after eating pizza.  Lasted for well over an hour and then got worse.  Denied any other particular trouble.  Now the discomfort has down gone away  Review of Systems     Objective:   Physical Exam General-in no acute distress Eyes-no discharge Lungs-respiratory rate normal, CTA CV-no murmurs,RRR Extremities skin warm dry no edema Neuro grossly normal Behavior normal, alert Right upper quadrant minimal tenderness       Assessment & Plan:  Right upper quadrant pain Lab work ultrasound ordered Rest up If reoccurring troubles or problems notify us Warning signs were discussed in detail

## 2023-02-10 ENCOUNTER — Ambulatory Visit (HOSPITAL_COMMUNITY): Payer: 59

## 2023-02-11 ENCOUNTER — Ambulatory Visit: Payer: 59 | Admitting: Gastroenterology

## 2023-02-24 ENCOUNTER — Encounter: Payer: Self-pay | Admitting: Urology

## 2023-02-24 ENCOUNTER — Ambulatory Visit: Payer: 59 | Admitting: Urology

## 2023-02-24 VITALS — BP 146/81 | HR 102

## 2023-02-24 DIAGNOSIS — Z3009 Encounter for other general counseling and advice on contraception: Secondary | ICD-10-CM | POA: Diagnosis not present

## 2023-02-24 MED ORDER — DIAZEPAM 10 MG PO TABS: 10.0000 mg | ORAL_TABLET | Freq: Once | ORAL | 0 refills | Status: AC

## 2023-02-24 NOTE — Patient Instructions (Signed)
Vasectomy Vasectomy is a procedure in which the vas deferens is cut and then tied or burned (cauterized). The vas deferens is a tube that carries sperm from the testicle to the part of the body that drains urine from the bladder (urethra). This procedure blocks sperm from going through the vas deferens and penis during ejaculation. This ensures that sperm does not go into the vagina during sex. Vasectomy does not affect sexual desire or performance and does not prevent sexually transmitted infections. Vasectomy is considered a permanent and very effective form of birth control (contraception). The decision to have a vasectomy should not be made during a stressful time, such as after the loss of a pregnancy or a divorce. You and your partner should decide on whether to have a vasectomy when you are sure that you do not want children in the future. Tell a health care provider about: Any allergies you have. All medicines you are taking, including vitamins, herbs, eye drops, creams, and over-the-counter medicines. Any problems you or family members have had with anesthetic medicines. Any blood disorders you have. Any surgeries you have had. Any medical conditions you have. What are the risks? Generally, this is a safe procedure. However, problems may occur, including: Infection. Bleeding and swelling of the scrotum. The scrotum is the sac that contains the testicles, blood vessels, and structures that help deliver sperm and semen. Allergic reactions to medicines. Failure of the procedure to prevent pregnancy. There is a very small chance that the tied or cauterized ends of the vas deferens may reconnect (recanalization). If this happens, you could still make a woman pregnant. Pain in the scrotum that continues after you heal from the procedure. What happens before the procedure? Medicines Ask your health care provider about: Changing or stopping your regular medicines. This is especially important if  you are taking diabetes medicines or blood thinners. Taking medicines such as aspirin and ibuprofen. These medicines can thin your blood. Do not take these medicines unless your health care provider tells you to take them. Taking over-the-counter medicines, vitamins, herbs, and supplements. You may be told to take a medicine to help you relax (sedative) a few hours before the procedure. General instructions Do not use any products that contain nicotine or tobacco for at least 4 weeks before the procedure. These products include cigarettes, e-cigarettes, and chewing tobacco. If you need help quitting, ask your health care provider. Plan to have a responsible adult take you home from the hospital or clinic. If you will be going home right after the procedure, plan to have a responsible adult care for you for the time you are told. This is important. Ask your health care provider: How your surgery site will be marked. What steps will be taken to help prevent infection. These steps may include: Removing hair at the surgery site. Washing skin with a germ-killing soap. Taking antibiotic medicine. What happens during the procedure?  You will be given one or more of the following: A sedative, unless you were told to take this a few hours before the procedure. A medicine to numb the area (local anesthetic). Your health care provider will feel, or palpate, for your vas deferens. To reach the vas deferens, one of two methods may be used: A very small incision may be made in your scrotum. A punctured opening may be made in your scrotum, without an incision. Your vas deferens will be pulled out of your scrotum and cut. Then, the vas deferens will be closed   in one of two ways: Tied at the ends. Cauterized at the ends to seal them off. The vas deferens will be put back into your scrotum. The incision or puncture opening will be closed with absorbable stitches (sutures). The sutures will eventually  dissolve and will not need to be removed after the procedure. The procedure will be repeated on the other side of your scrotum. The procedure may vary among health care providers and hospitals. What happens after the procedure? You will be monitored to make sure that you do not have problems. You will be asked not to ejaculate for at least 1 week after the procedure, or for as long as you are told. You will need to use a different form of contraception for 2-4 months after the procedure, until you have test results confirming that there are no sperm in your semen. You may be given scrotal support to wear, such as a jockstrap or underwear with a supportive pouch. If you were given a sedative during the procedure, it can affect you for several hours. Do not drive or operate machinery until your health care provider says that it is safe. Summary Vasectomy blocks sperm from being released during ejaculation. This procedure is considered a permanent and very effective form of birth control. Your scrotum will be numbed with medicine (local anesthetic) for the procedure. After the procedure, you will be asked not to ejaculate for at least 1 week, or for as long as you are told. You will also need to use a different form of contraception until your test results confirm that there are no sperm in your semen. This information is not intended to replace advice given to you by your health care provider. Make sure you discuss any questions you have with your health care provider. Document Revised: 03/09/2020 Document Reviewed: 03/09/2020 Elsevier Patient Education  2023 [employer]  
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
## 2023-04-14 ENCOUNTER — Telehealth: Payer: Self-pay

## 2023-04-14 NOTE — Telephone Encounter (Signed)
Patient called advising that he is being treated for a prostate infection. He started his antibiotic on 04/12/23 taking 2 pills daily for 14 days. He wanted to know if he could still proceed with the Vasectomy scheduled for 04/23/23.

## 2023-04-23 ENCOUNTER — Encounter: Payer: 59 | Admitting: Urology

## 2023-05-30 ENCOUNTER — Ambulatory Visit (INDEPENDENT_AMBULATORY_CARE_PROVIDER_SITE_OTHER): Payer: 59 | Admitting: Urology

## 2023-05-30 ENCOUNTER — Encounter: Payer: Self-pay | Admitting: Urology

## 2023-05-30 VITALS — BP 134/85 | HR 99

## 2023-05-30 DIAGNOSIS — Z302 Encounter for sterilization: Secondary | ICD-10-CM

## 2023-05-30 NOTE — Patient Instructions (Signed)
Vasectomy Postoperative Instructions ? ?Please bring back a semen analysis in approximately 3 months.  ?Your semen analysis  will need to be taken to  ?[facility]  [address], [place], [place] [address] ?[phone] ? ? You will be given a sterile specimen cup. Please label the cup with your name, date of birth, date and time of collections.  ?What to Expect ? - slight redness, swelling and scant drainage along the incision ? - mild to moderate discomfort ? - black and blue (bruising) as the tissue heals ? - low grade fever ? - scrotal sensitivity and/or tenderness ?- Edges of the incision may pull apart and heal slowly, sometimes a knot may be present which remains for several months.  This is NORMAL and all part of the healing process. ?- if stitches are placed, they do not need to be removed ?- if you have pain or discomfort immediately after the vasectomy, you may use OTC pain medication for relief , ex: tylenol.  After local anesthetic wears off an ice pack will provide additional comfort and can also prevent swelling if used ? ?Activity ? - no sexual intercourse for at lease 5 days depending on comfort ? - no heavy lifting for 48-72 hours (anything over 5-10 lbs) ? ?Wound Care ? - shower only after 24 hours ? - no tub baths, hot tub, or pools for at least 7 days ? - ice packs for 48 hours: 30 minutes on and 30 minutes off ? ?Problem to Report ? - generalized redness ? - increased pain and swelling ? - fever greater than 101 F ? - significant drainage or bleeding from the wound ? ?TO DO ?- Ejaculations help to clear the passage of sperm, but you must use another from of birth control until you are told you may discontinue its use!! ?- You will be given a specimen cup to bring back a semen sample in 3 months to check and see if its clear of sperm.  Only after the semen is sent for analysis and is reported back as clear should you use this as your primary form of birth control!    ?

## 2023-05-30 NOTE — Progress Notes (Unsigned)
05/30/23  CC: vasectomy   HPI: Patient is here for vasectopmy Blood pressure 134/85, pulse 99. NED. A&Ox3.   No respiratory distress   Abd soft, NT, ND Normal external genitalia with patent urethral meatus  A timeout was performed.  Patient's identity and consent was confirmed.  All questions were answered.   Bilateral Vasectomy Procedure  Pre-Procedure: - Patient's scrotum was prepped and draped for vasectomy. - The vas was palpated through the scrotal skin on the left. - 1% Xylocaine was injected into the skin and surrounding tissue for placement  - In a similar manner, the vas on the right was identified, anesthetized, and stabilized.  Procedure: - A sharp hemostat was used to make a small stab incision in the skin overlying the vas - The left vas was isolated and brought up through the incision exposing that structure. - Bleeding points were cauterized as they occurred. - The vas was free from the surrounding structures and brought to the view. - A segment was positioned for placement with a hemostat. - A second hemostat was placed and a small segment between the two hemostats and was removed for inspection. - Each end of the transected vas lumen was fulgurated/ obliterated using needlepoint electrocautery -A fascial interposition was performed on testicular end of the vas using #3-0 chromic suture -The same procedure was performed on the right. - A single suture of #3-0 chromic catgut was used to close each lateral scrotal skin incision - A dressing was applied.  Post-Procedure: - Patient was instructed in care of the operative area - A specimen is to be delivered in 12 weeks   -Another form of contraception is to be used until post vasectomy semen analysis  Wilkie Aye, MD

## 2023-08-07 LAB — POST-VAS SPERM EVALUATION,QUAL: Volume: 3.7 mL

## 2023-11-25 ENCOUNTER — Encounter: Payer: Self-pay | Admitting: Occupational Medicine

## 2023-11-25 ENCOUNTER — Ambulatory Visit
Admission: RE | Admit: 2023-11-25 | Discharge: 2023-11-25 | Disposition: A | Discharge status: Home / Self Care | Payer: Worker's Compensation | Source: Ambulatory Visit | Attending: Nurse Practitioner | Admitting: Nurse Practitioner

## 2023-11-25 ENCOUNTER — Other Ambulatory Visit: Payer: Self-pay | Admitting: Nurse Practitioner

## 2023-11-25 DIAGNOSIS — M79645 Pain in left finger(s): Secondary | ICD-10-CM

## 2023-12-26 ENCOUNTER — Ambulatory Visit: Payer: Self-pay | Admitting: Family Medicine

## 2023-12-26 ENCOUNTER — Ambulatory Visit: Payer: 59 | Admitting: Physician Assistant

## 2023-12-26 VITALS — BP 128/83 | HR 115 | Temp 99.3°F | Ht 72.0 in | Wt 263.2 lb

## 2023-12-26 DIAGNOSIS — R0989 Other specified symptoms and signs involving the circulatory and respiratory systems: Secondary | ICD-10-CM

## 2023-12-26 MED ORDER — PROMETHAZINE-DM 6.25-15 MG/5ML PO SYRP: 5.0000 mL | ORAL_SOLUTION | Freq: Four times a day (QID) | ORAL | 0 refills | Status: DC | PRN

## 2023-12-26 MED ORDER — OSELTAMIVIR PHOSPHATE 75 MG PO CAPS: 75.0000 mg | ORAL_CAPSULE | Freq: Two times a day (BID) | ORAL | 0 refills | Status: AC

## 2023-12-26 NOTE — Progress Notes (Signed)
   Acute Office Visit  Subjective:     Patient ID: AYDIAN DIMMICK, male    DOB: 1991-01-03, 33 y.o.   MRN: [id number]   HPI Patient is in today for cough and fever. He states symptoms began yesterday morning. Associated symptoms include- congestion, body aches, fatigue, chills, sore throat, and decreased appetite. He denies shortness of breath or chest pain. He is getting adequate fluid intake. He denies sick contacts. He has been taking Tylenol as needed for pain and fever.   Review of Systems  Constitutional:  Positive for chills, fever and malaise/fatigue.  HENT:  Positive for congestion and sore throat.   Respiratory:  Positive for cough. Negative for shortness of breath.   Cardiovascular:  Negative for chest pain.  Musculoskeletal:  Positive for myalgias.  Neurological:  Positive for headaches.        Objective:     BP 128/83   Pulse (!) 115   Temp 99.3 F (37.4 C)   Ht 6' (1.829 m)   Wt 263 lb 3.2 oz (119.4 kg)   SpO2 97%   BMI 35.70 kg/m   Physical Exam Vitals reviewed.  Constitutional:      Appearance: Normal appearance. He is ill-appearing.  HENT:     Nose: Nose normal. No congestion.     Mouth/Throat:     Mouth: Mucous membranes are moist.     Pharynx: Oropharynx is clear.  Eyes:     Extraocular Movements: Extraocular movements intact.     Conjunctiva/sclera: Conjunctivae normal.  Cardiovascular:     Rate and Rhythm: Regular rhythm. Tachycardia present.     Heart sounds: No murmur heard.    No friction rub. No gallop.  Pulmonary:     Effort: Pulmonary effort is normal.     Breath sounds: Normal breath sounds. No stridor. No wheezing, rhonchi or rales.  Musculoskeletal:        General: Normal range of motion.  Skin:    General: Skin is warm and dry.     Capillary Refill: Capillary refill takes less than 2 seconds.  Neurological:     General: No focal deficit present.     Mental Status: He is alert and oriented to person, place, and time.   Psychiatric:        Mood and Affect: Mood normal.        Behavior: Behavior normal.     No results found for any visits on 12/26/23.      Assessment & Plan:  Suspected novel influenza A virus infection -     Oseltamivir Phosphate; Take 1 capsule (75 mg total) by mouth 2 (two) times daily for 5 days.  Dispense: 10 capsule; Refill: 0 -     Promethazine-DM; Take 5 mLs by mouth 4 (four) times daily as needed.  Dispense: 118 mL; Refill: 0  Patient appears stable today. Benign exam. Likely self-resolving influenza infection. Supportive care reviewed with patient. Discussed with patient that there are no indications for antibiotics at this time, and viral respiratory illness can be persistent in duration.Tamiflu x5 days. Tylenol or ibuprofen for pain or fever as needed. May continue with OTC cold medications. Patient instructed to return to clinic if worsening shortness of breath, chest pain, hypoxia, or other concerns. Patient agreeable to plan.    Return if symptoms worsen or fail to improve.  Toni Amend Kristalyn Bergstresser, PA-C

## 2024-01-05 ENCOUNTER — Ambulatory Visit: Payer: 59 | Admitting: Adult Health

## 2024-01-13 ENCOUNTER — Telehealth: Admitting: Physician Assistant

## 2024-01-13 DIAGNOSIS — B9789 Other viral agents as the cause of diseases classified elsewhere: Secondary | ICD-10-CM

## 2024-01-13 DIAGNOSIS — J019 Acute sinusitis, unspecified: Secondary | ICD-10-CM | POA: Diagnosis not present

## 2024-01-13 MED ORDER — FLUTICASONE PROPIONATE 50 MCG/ACT NA SUSP: 2.0000 | Freq: Every day | NASAL | 0 refills | Status: DC

## 2024-01-13 NOTE — Progress Notes (Signed)
E-Visit for Sinus Problems  We are sorry that you are not feeling well.  Here is how we plan to help!  Based on what you have shared with me it looks like you have sinusitis.  Sinusitis is inflammation and infection in the sinus cavities of the head.  Based on your presentation I believe you most likely have Acute Viral Sinusitis.This is an infection most likely caused by a virus. There is not specific treatment for viral sinusitis other than to help you with the symptoms until the infection runs its course.  You may use an oral decongestant such as Mucinex D or if you have glaucoma or high blood pressure use plain Mucinex. Saline nasal spray help and can safely be used as often as needed for congestion, I have prescribed: Fluticasone nasal spray two sprays in each nostril once a day for 10 -14 days.  Some authorities believe that zinc sprays or the use of Echinacea may shorten the course of your symptoms.  Sinus infections are not as easily transmitted as other respiratory infection, however we still recommend that you avoid close contact with loved ones, especially the very young and elderly.  Remember to wash your hands thoroughly throughout the day as this is the number one way to prevent the spread of infection!  Home Care: Only take medications as instructed by your medical team. Do not take these medications with alcohol. A steam or ultrasonic humidifier can help congestion.  You can place a towel over your head and breathe in the steam from hot water coming from a faucet. Avoid close contacts especially the very young and the elderly. Cover your mouth when you cough or sneeze. Always remember to wash your hands.  Get Help Right Away If: You develop worsening fever or sinus pain. You develop a severe head ache or visual changes. Your symptoms persist after you have completed your treatment plan.  Make sure you Understand these instructions. Will watch your condition. Will get help  right away if you are not doing well or get worse.   Thank you for choosing an e-visit.  Your e-visit answers were reviewed by a board certified advanced clinical practitioner to complete your personal care plan. Depending upon the condition, your plan could have included both over the counter or prescription medications.  Please review your pharmacy choice. Make sure the pharmacy is open so you can pick up prescription now. If there is a problem, you may contact your provider through MyChart messaging and have the prescription routed to another pharmacy.  Your safety is important to us. If you have drug allergies check your prescription carefully.   For the next 24 hours you can use MyChart to ask questions about today's visit, request a non-urgent call back, or ask for a work or school excuse. You will get an email in the next two days asking about your experience. I hope that your e-visit has been valuable and will speed your recovery.   I have spent 5 minutes in review of e-visit questionnaire, review and updating patient chart, medical decision making and response to patient.   Nakayla Rorabaugh M Cyrstal Leitz, PA-C  

## 2024-08-02 ENCOUNTER — Ambulatory Visit: Admitting: Physician Assistant

## 2024-08-02 VITALS — BP 149/67 | HR 102 | Ht 72.0 in

## 2024-08-02 DIAGNOSIS — M7671 Peroneal tendinitis, right leg: Secondary | ICD-10-CM | POA: Diagnosis not present

## 2024-08-02 NOTE — Assessment & Plan Note (Signed)
 Right ankle pain for three weeks, likely tendinitis or overuse injury. Discussed no x-ray needed due to absence of acute injury or significant swelling, bruising, and lack of physical exam findings. - Provided handout with ankle strengthening exercises. - Recommended ice and heat therapy, discussed rest and elevation when possible. - Offered topical Voltaren  gel for pain and inflammation, however patient defers at this time. - Advised ibuprofen  or Tylenol  as needed for discomfort. - Follow up if symptoms worsen or do not improve in the next 2-4 weeks.
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
## 2024-09-06 ENCOUNTER — Telehealth: Payer: Self-pay | Admitting: Adult Health

## 2024-09-06 ENCOUNTER — Encounter: Payer: Self-pay | Admitting: Physician Assistant

## 2024-09-06 NOTE — Telephone Encounter (Signed)
 Patient called to reschedule appointment. Informed to bring [employer] and the power cord.

## 2024-09-07 ENCOUNTER — Other Ambulatory Visit: Payer: Self-pay | Admitting: Physician Assistant

## 2024-09-07 DIAGNOSIS — D751 Secondary polycythemia: Secondary | ICD-10-CM

## 2024-09-07 DIAGNOSIS — R748 Abnormal levels of other serum enzymes: Secondary | ICD-10-CM

## 2024-09-07 DIAGNOSIS — Z13220 Encounter for screening for lipoid disorders: Secondary | ICD-10-CM

## 2024-09-07 DIAGNOSIS — R5382 Chronic fatigue, unspecified: Secondary | ICD-10-CM

## 2024-09-07 DIAGNOSIS — E559 Vitamin D deficiency, unspecified: Secondary | ICD-10-CM

## 2024-09-14 ENCOUNTER — Ambulatory Visit: Payer: Self-pay | Admitting: Physician Assistant

## 2024-09-14 LAB — CBC WITH DIFFERENTIAL/PLATELET
Basophils Absolute: 0.1 x10E3/uL (ref 0.0–0.2)
Basos: 1 %
EOS (ABSOLUTE): 0.2 x10E3/uL (ref 0.0–0.4)
Eos: 3 %
Hematocrit: 49.7 % (ref 37.5–51.0)
Hemoglobin: 16.5 g/dL (ref 13.0–17.7)
Immature Grans (Abs): 0.1 x10E3/uL (ref 0.0–0.1)
Immature Granulocytes: 1 %
Lymphocytes Absolute: 1.7 x10E3/uL (ref 0.7–3.1)
Lymphs: 25 %
MCH: 29.8 pg (ref 26.6–33.0)
MCHC: 33.2 g/dL (ref 31.5–35.7)
MCV: 90 fL (ref 79–97)
Monocytes Absolute: 0.5 x10E3/uL (ref 0.1–0.9)
Monocytes: 7 %
Neutrophils Absolute: 4.3 x10E3/uL (ref 1.4–7.0)
Neutrophils: 63 %
Platelets: 221 x10E3/uL (ref 150–450)
RBC: 5.53 x10E6/uL (ref 4.14–5.80)
RDW: 11.7 % (ref 11.6–15.4)
WBC: 6.8 x10E3/uL (ref 3.4–10.8)

## 2024-09-14 LAB — CMP14+EGFR
ALT: 39 IU/L (ref 0–44)
AST: 19 IU/L (ref 0–40)
Albumin: 4.9 g/dL (ref 4.1–5.1)
Alkaline Phosphatase: 61 IU/L (ref 47–123)
BUN/Creatinine Ratio: 14 (ref 9–20)
BUN: 14 mg/dL (ref 6–20)
Bilirubin Total: 0.6 mg/dL (ref 0.0–1.2)
CO2: 22 mmol/L (ref 20–29)
Calcium: 9.8 mg/dL (ref 8.7–10.2)
Chloride: 102 mmol/L (ref 96–106)
Creatinine, Ser: 0.99 mg/dL (ref 0.76–1.27)
Globulin, Total: 2.2 g/dL (ref 1.5–4.5)
Glucose: 84 mg/dL (ref 70–99)
Potassium: 4.7 mmol/L (ref 3.5–5.2)
Sodium: 139 mmol/L (ref 134–144)
Total Protein: 7.1 g/dL (ref 6.0–8.5)
eGFR: 104 mL/min/1.73 (ref >59)

## 2024-09-14 LAB — VITAMIN B12: Vitamin B-12: 493 pg/mL (ref 232–1245)

## 2024-09-14 LAB — LIPID PANEL
Chol/HDL Ratio: 4.4 ratio (ref 0.0–5.0)
Cholesterol, Total: 162 mg/dL (ref 100–199)
HDL: 37 mg/dL — ABNORMAL LOW (ref >39)
LDL Chol Calc (NIH): 99 mg/dL (ref 0–99)
Triglycerides: 147 mg/dL (ref 0–149)
VLDL Cholesterol Cal: 26 mg/dL (ref 5–40)

## 2024-09-14 LAB — VITAMIN D 25 HYDROXY (VIT D DEFICIENCY, FRACTURES): Vit D, 25-Hydroxy: 33.7 ng/mL (ref 30.0–100.0)

## 2024-09-14 LAB — TSH+FREE T4
Free T4: 1.53 ng/dL (ref 0.82–1.77)
TSH: 1.27 u[IU]/mL (ref 0.450–4.500)

## 2024-09-27 ENCOUNTER — Ambulatory Visit: Admitting: Physician Assistant

## 2024-09-27 ENCOUNTER — Encounter: Payer: Self-pay | Admitting: Physician Assistant

## 2024-09-27 VITALS — BP 134/84 | HR 96 | Temp 98.2°F | Ht 72.0 in | Wt 271.8 lb

## 2024-09-27 DIAGNOSIS — Z0000 Encounter for general adult medical examination without abnormal findings: Secondary | ICD-10-CM | POA: Diagnosis not present

## 2024-09-27 DIAGNOSIS — G4733 Obstructive sleep apnea (adult) (pediatric): Secondary | ICD-10-CM | POA: Diagnosis not present

## 2024-09-27 MED ORDER — ZEPBOUND 2.5 MG/0.5ML ~~LOC~~ SOAJ: 2.5000 mg | SUBCUTANEOUS | 0 refills | Status: DC

## 2024-09-27 MED ORDER — ZEPBOUND 7.5 MG/0.5ML ~~LOC~~ SOAJ
7.5000 mg | SUBCUTANEOUS | 1 refills | Status: DC
  Filled 2024-10-22 – 2024-11-12 (×2): qty 6, 84d supply, fill #0

## 2024-09-27 MED ORDER — ZEPBOUND 5 MG/0.5ML ~~LOC~~ SOAJ
5.0000 mg | SUBCUTANEOUS | 0 refills | Status: DC
  Filled 2024-10-22: qty 2, 28d supply, fill #0

## 2024-09-27 NOTE — Patient Instructions (Signed)
 Thank you for inquiring about GLP-1 medicines for weight loss.  These medications typically go by the name of Wegovy or Zepbound .  Please be aware that the Ozempic and Mounjaro  are only for diabetics who are not well-controlled with other measures. These medications can be beneficial with helping people lose weight. They work by slowing down the intestine and lessening appetite, the combination of this action typically helps with weight loss. Not everybody can tolerate these medicines.  Approximately 15 out of the 100 people cannot tolerate these medicines. Of all the individuals who try these medicines for weight loss not everybody achieves dramatic weight loss results.  Although the these medications can help a person lose sometimes 10 or 15% of their body weight it should be noted that the weight loss is typically over using the medicine on a monthly basis for 6 to 12 months along with healthy eating and activity.  Also if a person chooses to stop the medicine studies show that most individuals regain their weight within 6 months.  So in other words this type of medication is not a 29-month solution but instead is considered a lifelong medication.  For some individuals this is not what they would want to do-so please take this information into account with your thoughts and decisions.  As for coverage of these medicines-this is a more complex issue. These medications on average are about $1100 a month.  Some insurance companies will cover these some do not.  This can vary from plan to plan.  This is based upon the plan that your employer has with the insurance company. [employer] companies have specific stipulations that have to be met in order to approve the medication. In our experience a large portion of insurance companies do not cover these medications.  Sometimes these medications come with a low co-pay at other times the co-pay is quite expensive.  If you are interested in these  medications we would recommend the following process #1-please talk with your insurance company to find out if these medications are covered.  Specifically you need to ask if [facility] or Zepbound  are covered by your policy for weight loss. #2 if the insurance company says it is covered then it is highly important to asked them if there are any stipulations or requirements such as monitored weight loss program for 6 months or trying and failing other measures first #3 please understand we will try our best to help you.  But it is in our experience that often insurance companies on the phone with the patient will often say that the medications are covered.  Please be aware that the individuals that you speak with on the phone regarding if the medicine is covered are not the same individuals who do the prior approvals and determine if the medicine is in fact covered.  But often when we tried to go through prior approvals to get the medication covered we find out that the medication is not covered by the insurance company for that specific employer plan.  If that is the case there is nothing we can do about that.   If the insurance company says the medication is covered I would recommend that you ask what the co-pay is per month. #4 finally if the insurance company states they do cover the medicine-then a up to date office visit with a current weight as well as detailed discussion regarding what has been tried, dietary measures, exercise measures etc. is required to move forward with any  ordering of medication.  Also during that visit we will have a detailed discussion regarding potential benefits as well as risk of GLP-1 medicines. #5 for those specific visits-that topic-weight loss and potential medication prescription-is the only topic we can cover at that visit because there will not be time to cover other health concerns.  If you have other health concerns such as chronic health issues or other acute measures  it will be necessary to schedule a separate visit for those.  If you have questions regarding the above please feel free to message and our staff will do the best we can to address those.  Otherwise please follow the above measures then if you are of the mindset that the medication would be covered and you would like to have that medicine it would be necessary to schedule an office visit to proceed forward.  Universally we are not able to order this medication via MyChart message and get it approved.  It just does not work that way.  Our goal is to help you.  If the medication is not available to you we are more than willing to talk with you about your weight and things you can do outside of that medicine to try to help.  We look forward to seeing you- Please take care [facility]

## 2024-09-27 NOTE — Assessment & Plan Note (Signed)
 Patient with severe obstructive sleep apnea that appears to be uncontrolled due to reported poor sleep. He is interested in Zepbound  management. Discussion on potential side effects and cost barrier. Recent lab reviewed and within normal limits. Discussed titration of dosing. Start with 2.5 mg weekly for one month and increase as tolerated. Follow up in 3 months for re-evaluation and lab work, sooner if concerns or new symptoms arise.
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
## 2024-09-28 ENCOUNTER — Other Ambulatory Visit (HOSPITAL_BASED_OUTPATIENT_CLINIC_OR_DEPARTMENT_OTHER): Payer: Self-pay

## 2024-09-28 ENCOUNTER — Telehealth: Payer: Self-pay | Admitting: Pharmacy Technician

## 2024-09-28 ENCOUNTER — Other Ambulatory Visit (HOSPITAL_COMMUNITY): Payer: Self-pay

## 2024-09-28 MED ORDER — TIRZEPATIDE-WEIGHT MANAGEMENT 2.5 MG/0.5ML ~~LOC~~ SOAJ
2.5000 mg | SUBCUTANEOUS | 0 refills | Status: DC
  Filled 2024-09-28: qty 2, 28d supply, fill #0

## 2024-09-28 NOTE — Telephone Encounter (Signed)
 Pharmacy Patient Advocate Encounter  Received notification from [employer] that Prior Authorization for  Zepbound  2.5MG /0.5ML pen-injectors has been APPROVED from 09/28/2024 to 03/28/2025. Ran test claim, Copay is $24.99. This test claim was processed through [facility]- copay amounts may vary at other pharmacies due to pharmacy/plan contracts, or as the patient moves through the different stages of their insurance plan.   PA #/Case ID/Reference #: [id number]

## 2024-09-28 NOTE — Telephone Encounter (Signed)
 Pharmacy Patient Advocate Encounter   Received notification from [employer]base that prior authorization for Zepbound  2.5MG /0.5ML pen-injectors is required/requested.   Insurance verification completed.   The patient is insured through [employer].   Per test claim: PA required; PA submitted to above mentioned insurance via Latent Key/confirmation #/EOC [facility] Status is pending
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
## 2024-10-11 ENCOUNTER — Encounter: Payer: Self-pay | Admitting: Adult Health

## 2024-10-11 ENCOUNTER — Ambulatory Visit: Admitting: Adult Health

## 2024-10-11 VITALS — BP 111/76 | HR 79 | Ht 72.0 in | Wt 264.0 lb

## 2024-10-11 DIAGNOSIS — G4733 Obstructive sleep apnea (adult) (pediatric): Secondary | ICD-10-CM | POA: Diagnosis not present

## 2024-10-11 NOTE — Patient Instructions (Signed)
 Your Plan:  Continue nightly use of CPAP with ensuring greater than 4 hours per night for optimal benefit and returns requirements  Continue to follow with DME [facility] for any needed supplies or CPAP related concerns     Follow-up in 1 year or call earlier if needed     Thank you for coming to see us  at [facility]. I hope we have been able to provide you high quality care today.  You may receive a patient satisfaction survey over the next few weeks. We would appreciate your feedback and comments so that we may continue to improve ourselves and the health of our patients.

## 2024-10-22 ENCOUNTER — Other Ambulatory Visit (HOSPITAL_BASED_OUTPATIENT_CLINIC_OR_DEPARTMENT_OTHER): Payer: Self-pay

## 2024-11-12 ENCOUNTER — Ambulatory Visit: Admitting: Family Medicine

## 2024-11-12 ENCOUNTER — Other Ambulatory Visit: Payer: Self-pay

## 2024-11-12 ENCOUNTER — Other Ambulatory Visit (HOSPITAL_BASED_OUTPATIENT_CLINIC_OR_DEPARTMENT_OTHER): Payer: Self-pay

## 2024-11-12 VITALS — BP 112/81 | HR 85 | Temp 98.4°F | Ht 72.0 in | Wt 244.2 lb

## 2024-11-12 DIAGNOSIS — E66811 Obesity, class 1: Secondary | ICD-10-CM

## 2024-11-12 DIAGNOSIS — G4733 Obstructive sleep apnea (adult) (pediatric): Secondary | ICD-10-CM

## 2024-11-12 NOTE — Progress Notes (Signed)
 Thank  Subjective:    Patient ID: Luis Jarvis, male    DOB: 06/01/1991, 34 y.o.   MRN: [id number]  HPI Patient is room 9  Patient is here for a follow up on zepbound  medication. Patient has significant sleep apnea He uses CPAP machine but still has a lot of additional symptoms Therefore he has been using Zepbound  He has had significant weight loss He is doing his best to adhere to a healthy diet Eats a protein focused diet.  Has eggs in the meat in the morning Has protein with a healthy meat at lunch and dinner with vegetables Sticking with water and black coffee Tolerating things well Is interested in continuing to go up on the dose  Patient states he is very pleased with the medication  Patient relates that his mom had thyroid  cancer.  He states that he will find out the type of thyroid  cancer and message us  or have his mom send the type to us .  Review of Systems     Objective:   Physical Exam General-in no acute distress Eyes-no discharge Lungs-respiratory rate normal, CTA CV-no murmurs,RRR Extremities skin warm dry no edema Neuro grossly normal Behavior normal, alert Neck no abnormal areas noted       Assessment & Plan:   Sleep apnea-the purpose of utilizing Zepbound  is to treat his sleep apnea to get better. To degree it would also help with his obesity. On Zepbound  Tolerating well He will make sure that his mothers type of thyroid  cancer is forwarded to us   The plan is to continue to increase Zepbound  per month He is currently on the 5 mg Next month 7.5 The following month 10 mg Then we will maintain at 10 mg until he follows up in 4 months Lab work in approximately 6 months to 8 months

## 2024-11-18 ENCOUNTER — Encounter: Payer: Self-pay | Admitting: Family Medicine

## 2024-11-18 ENCOUNTER — Telehealth: Payer: Self-pay | Admitting: Family Medicine

## 2024-11-18 ENCOUNTER — Other Ambulatory Visit (HOSPITAL_BASED_OUTPATIENT_CLINIC_OR_DEPARTMENT_OTHER): Payer: Self-pay

## 2024-11-18 NOTE — Telephone Encounter (Signed)
 Spoke with both [facility] and [facility] to inform zepbound  is now discontinued ,  spoke with patient he has read [facility]pl group and verbalizes understanding , he would like to inquire about different treatment options

## 2024-11-18 NOTE — Telephone Encounter (Signed)
 The patient sent a MyChart message stating that his mom had a history of thyroid  medullary cancer I sent him a MyChart message Based on current guidelines from the FDA it is not recommended to continue Zepbound .  I would recommend that he stop the Zepbound .  Please call [facility] and let them know that this medication has been discontinued.  Please document that the patient understands to stop the Zepbound  (Please feel free to read through MyChart message as well for further information)

## 2024-11-22 ENCOUNTER — Other Ambulatory Visit: Payer: Self-pay | Admitting: Family Medicine

## 2024-11-26 ENCOUNTER — Other Ambulatory Visit (HOSPITAL_BASED_OUTPATIENT_CLINIC_OR_DEPARTMENT_OTHER): Payer: Self-pay

## 2024-11-26 ENCOUNTER — Other Ambulatory Visit: Payer: Self-pay | Admitting: Family Medicine

## 2024-11-26 ENCOUNTER — Telehealth (HOSPITAL_BASED_OUTPATIENT_CLINIC_OR_DEPARTMENT_OTHER): Payer: Self-pay

## 2024-11-26 MED ORDER — PHENTERMINE-TOPIRAMATE ER 3.75-23 MG PO CP24
1.0000 | ORAL_CAPSULE | Freq: Every morning | ORAL | 1 refills | Status: AC
  Filled 2024-11-26: qty 30, 30d supply, fill #0

## 2024-11-26 NOTE — Telephone Encounter (Signed)
 Where is this request coming from? [facility]: Phentermine-Topiramate ER 3.75-23 mg Prior authorization required? Yes If YES, on primary or secondary insurance? Primary Comments:

## 2024-11-26 NOTE — Telephone Encounter (Signed)
 PA request has been Received. New Encounter has been or will be created for follow up. For additional info see Pharmacy Prior Auth telephone encounter from 11/26/24.

## 2024-11-26 NOTE — Telephone Encounter (Signed)
 Pharmacy Patient Advocate Encounter  Received notification from [employer] that Prior Authorization for Phentermine -Topiramate  ER 3.75-23MG  er capsules  has been APPROVED from 11/26/24 to 03/26/25. Ran test claim, Copay is $50. This test claim was processed through [facility]- copay amounts may vary at other pharmacies due to pharmacy/plan contracts, or as the patient moves through the different stages of their insurance plan.   PA #/Case ID/Reference #: [id number]

## 2024-11-26 NOTE — Telephone Encounter (Signed)
 Pharmacy Patient Advocate Encounter   Received notification from Pt Calls Messages that prior authorization for Phentermine -Topiramate  ER 3.75-23MG  er capsules  is required/requested.   Insurance verification completed.   The patient is insured through [facility].   Per test claim: PA required; PA submitted to above mentioned insurance via Latent Key/confirmation #/[id number] Status is pending

## 2024-12-02 ENCOUNTER — Other Ambulatory Visit (HOSPITAL_BASED_OUTPATIENT_CLINIC_OR_DEPARTMENT_OTHER): Payer: Self-pay

## 2024-12-03 ENCOUNTER — Other Ambulatory Visit (HOSPITAL_BASED_OUTPATIENT_CLINIC_OR_DEPARTMENT_OTHER): Payer: Self-pay

## 2024-12-28 ENCOUNTER — Ambulatory Visit: Admitting: Family Medicine

## 2025-01-04 ENCOUNTER — Ambulatory Visit: Admitting: Family Medicine

## 2025-03-18 ENCOUNTER — Ambulatory Visit: Admitting: Family Medicine

## 2025-10-13 ENCOUNTER — Telehealth: Admitting: Adult Health
